# Patient Record
Sex: Female | Born: 1975 | Hispanic: No | Marital: Married | Smoking: Former smoker
Health system: Southern US, Community
[De-identification: ages and names within clinical notes are randomized; demographics above are authoritative.]

---

## 2007-06-22 ENCOUNTER — Inpatient Hospital Stay (HOSPITAL_COMMUNITY): Admission: AD | Admit: 2007-06-22 | Discharge: 2007-06-24 | Payer: Self-pay | Admitting: Obstetrics and Gynecology

## 2009-03-16 ENCOUNTER — Ambulatory Visit: Payer: Self-pay | Admitting: Internal Medicine

## 2009-03-16 DIAGNOSIS — J069 Acute upper respiratory infection, unspecified: Secondary | ICD-10-CM | POA: Insufficient documentation

## 2009-03-23 ENCOUNTER — Ambulatory Visit: Payer: Self-pay | Admitting: Internal Medicine

## 2009-03-23 DIAGNOSIS — J029 Acute pharyngitis, unspecified: Secondary | ICD-10-CM

## 2010-01-29 ENCOUNTER — Emergency Department (HOSPITAL_COMMUNITY)
Admission: EM | Admit: 2010-01-29 | Discharge: 2010-01-29 | Payer: Self-pay | Source: Home / Self Care | Admitting: Emergency Medicine

## 2010-01-29 IMAGING — CR DG CHEST 2V
2 series · 2 of 2 positions shown · non-contrast
Comparison: None.

CLINICAL DATA: Chest pain, cough

CHEST - 2 VIEW

[w chest pa]
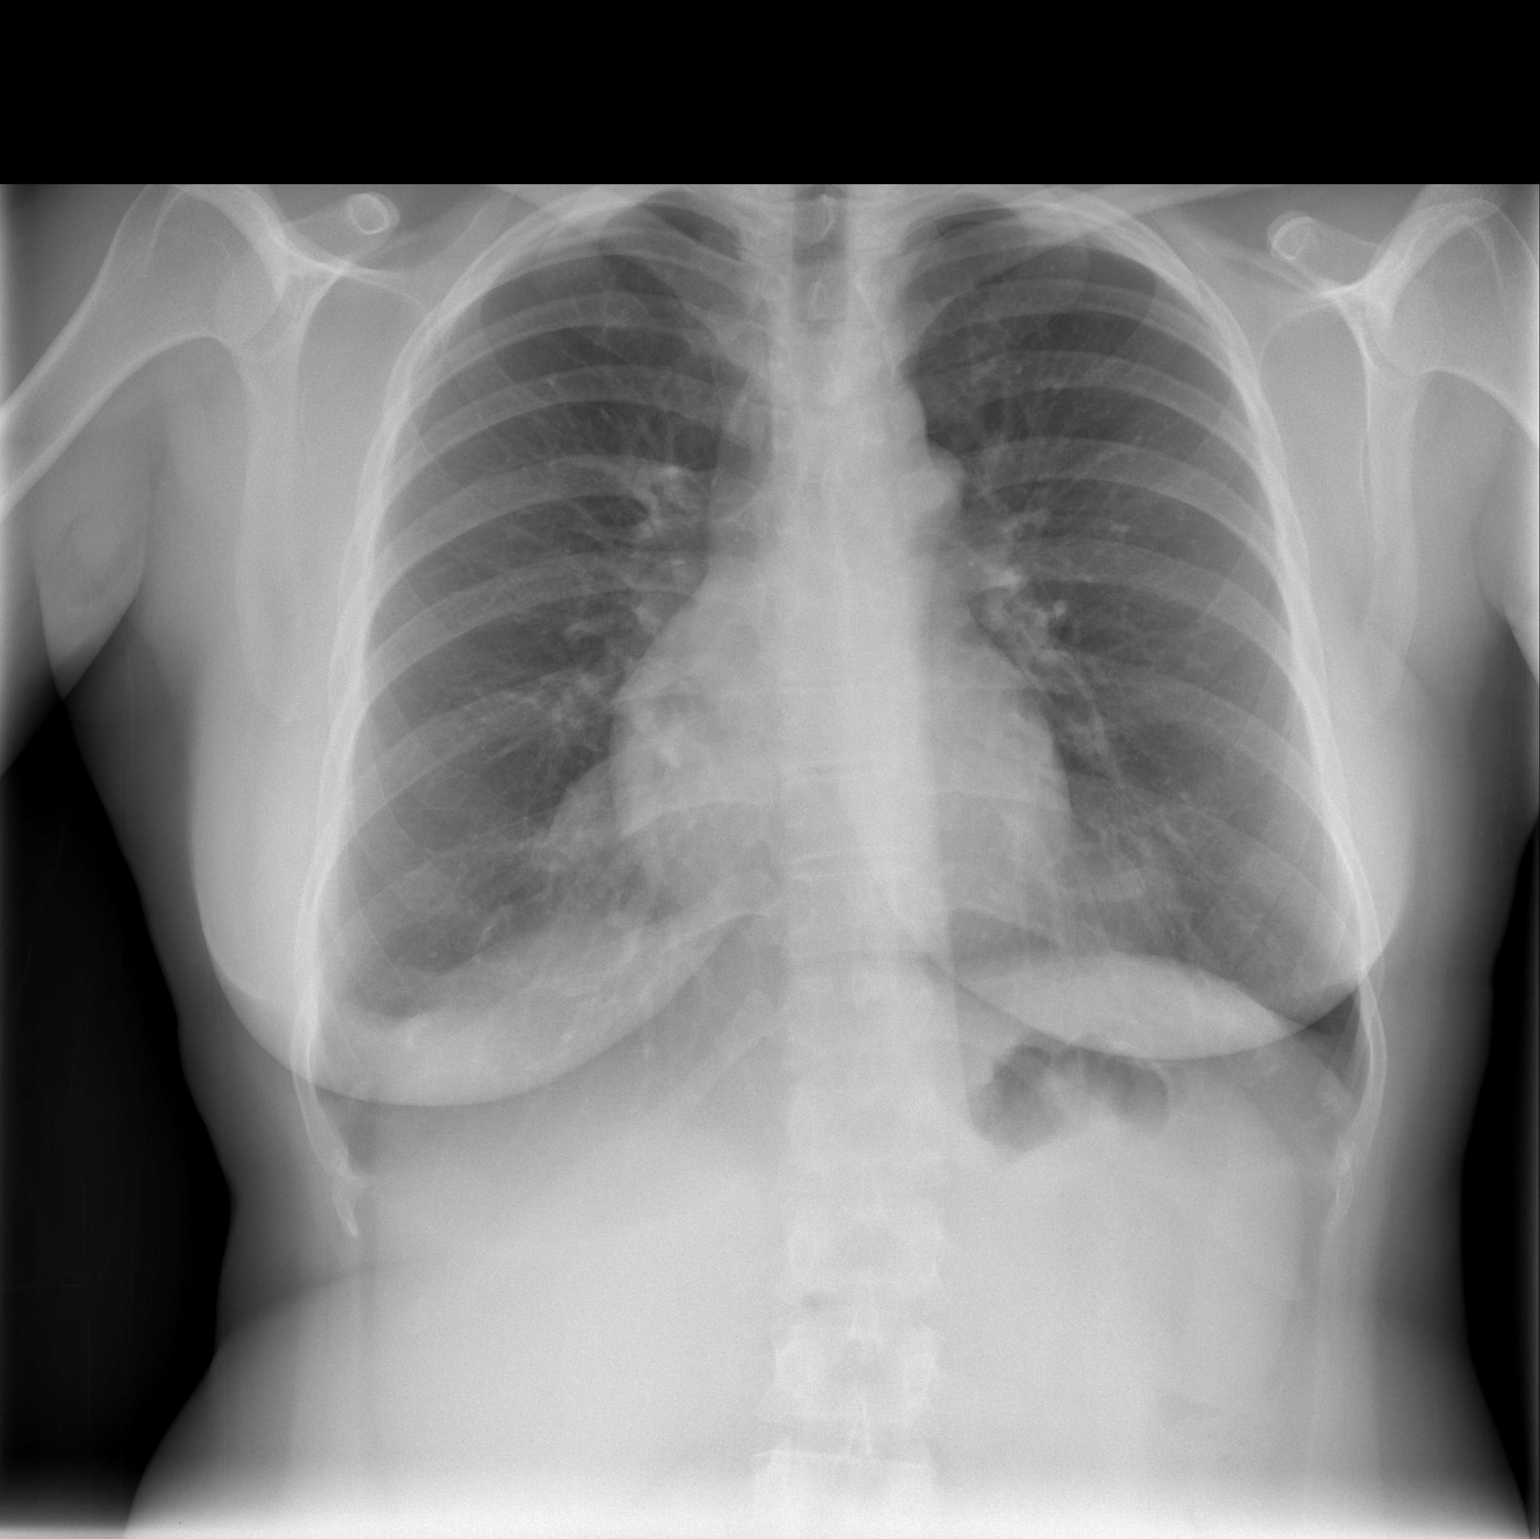

[w chest lat]
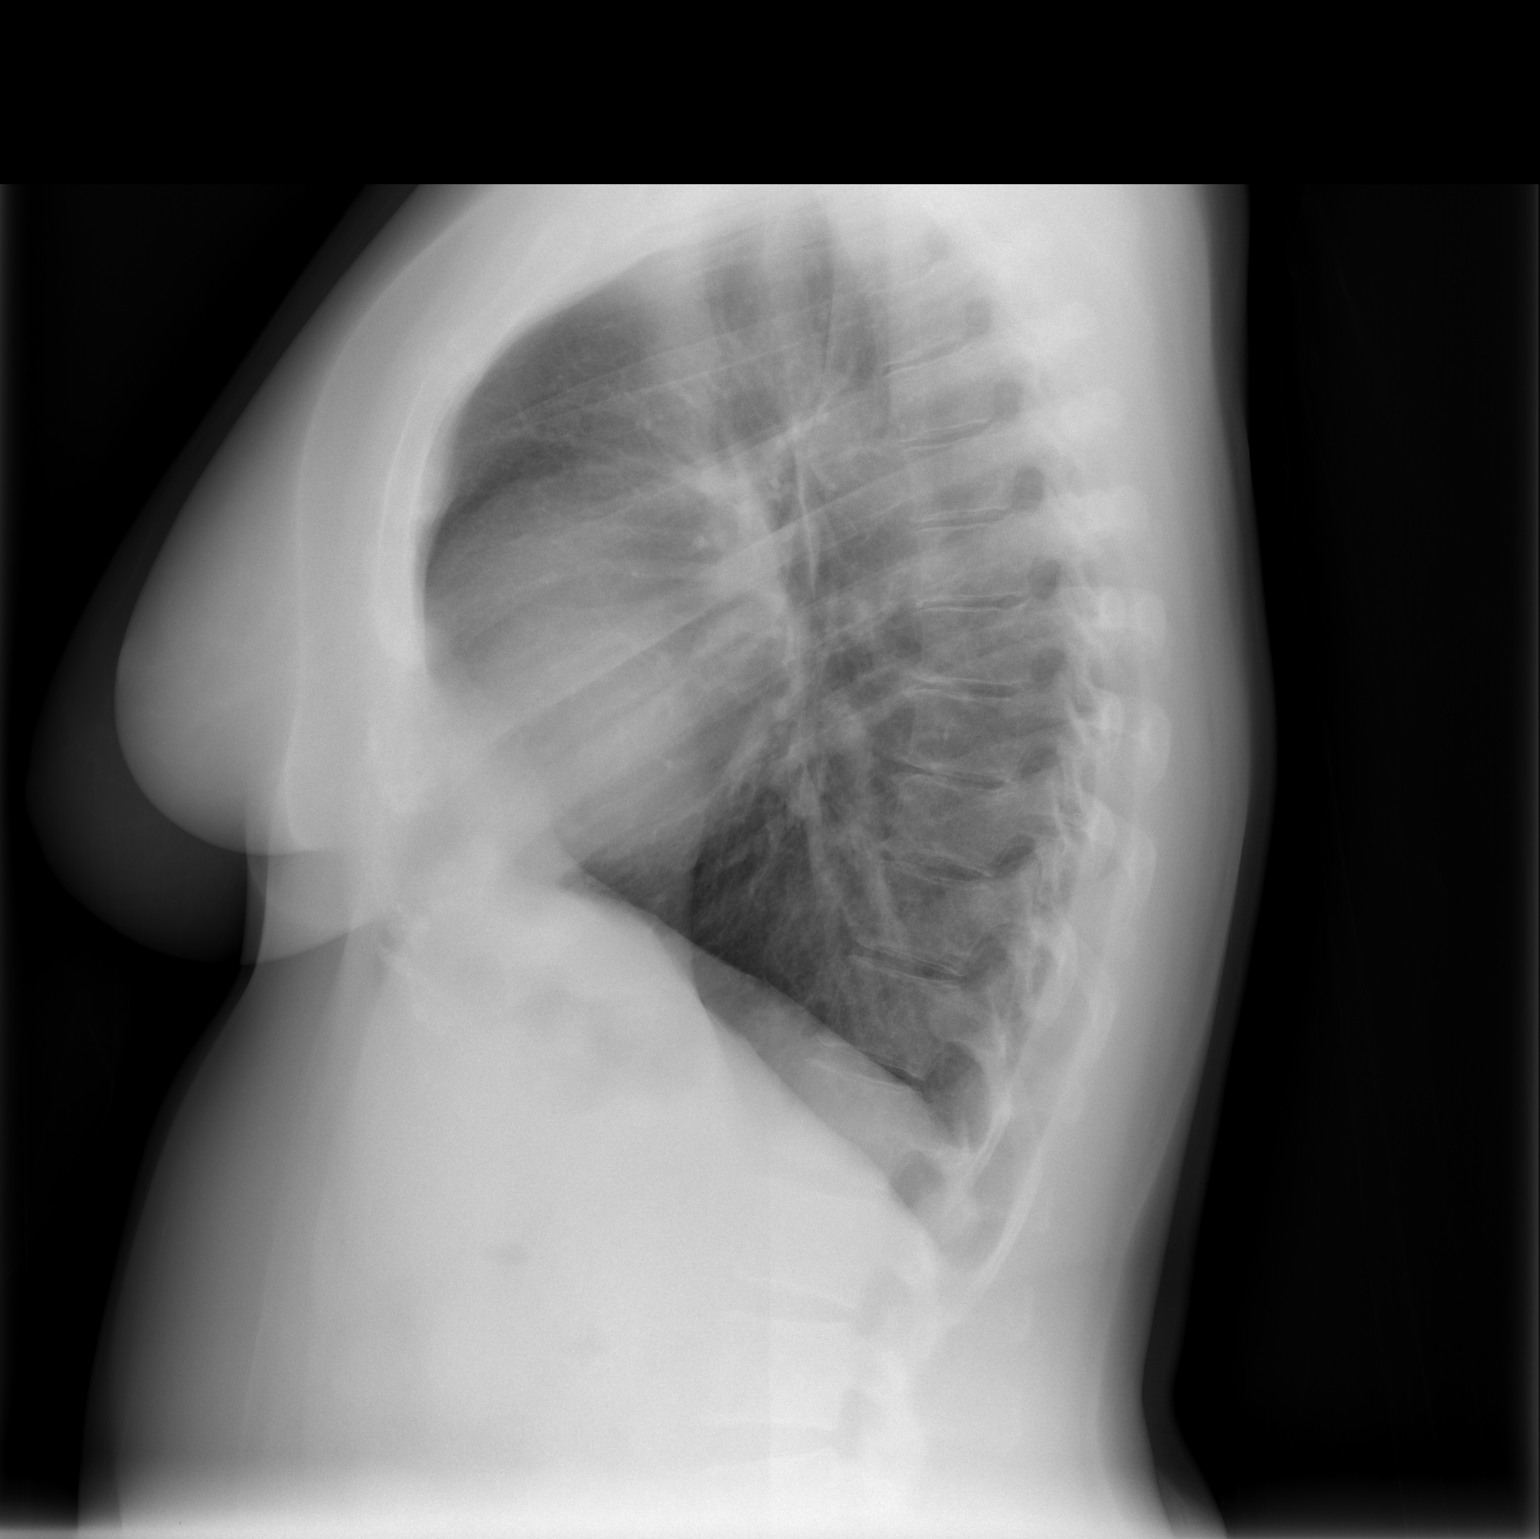

[2 of 2 positions shown; findings below may reference images not displayed]

FINDINGS: Normal heart size and vascularity.  Negative for
pneumonia, edema, effusion or pneumothorax.  Midline trachea.
Small nodular density noted along the left superior hilum along the
aortic contour.  No available comparison studies.  This is
nonspecific but could represent prominent vascular markings,
however underlying adenopathy not excluded.  Consider short-term
follow-up versus non emergent chest CT evaluation.
IMPRESSION: No acute airspace disease or pneumonia

Nodular density along the left hilum, as described.

## 2010-01-31 ENCOUNTER — Ambulatory Visit (HOSPITAL_COMMUNITY)
Admission: RE | Admit: 2010-01-31 | Discharge: 2010-01-31 | Payer: Self-pay | Source: Home / Self Care | Attending: Emergency Medicine | Admitting: Emergency Medicine

## 2010-02-28 ENCOUNTER — Telehealth: Payer: Self-pay

## 2010-03-07 NOTE — Assessment & Plan Note (Signed)
Summary: BRAND NEW PT/TO EST/CJR   Vital Signs:  Patient profile:   35 year old female Height:      60.5 inches Weight:      149 pounds BMI:     28.72 O2 Sat:      97 % on Room air Temp:     98.5 degrees F BP sitting:   120 / 76  (left arm) Cuff size:   regular  Vitals Entered By: Duard Brady LPN (March 16, 2009 3:00 PM)  O2 Flow:  Room air CC: c/o congestion, little coughing, sore throat - was seen in urgent care x2  biaxin 02/25/09 , amox 03/11/09 and was given an inhaler   CC:  c/o congestion, little coughing, sore throat - was seen in urgent care x2  biaxin 02/25/09 , and amox 03/11/09 and was given an inhaler.  History of Present Illness: 35 year old patient who is seen today to establish with our practice.  She has been evaluated and treated at the urgent care for an illness that has lasted  for 4 to 6 weeks.  She has had cough, sore throat, and earlier had fever.  On January 22 she was treated with Biaxin in 5 days ago was retreated with amoxicillin and a inhaler.  Denies any history of asthma or any recent wheezing.  She did discontinue tobacco 3 weeks ago.  Today she is improved.  She has no cough, but minimal congestion, and slight sore throat.  She is completing amoxicillin at this time  Preventive Screening-Counseling & Management  Alcohol-Tobacco     Smoking Status: quit < 6 months  Caffeine-Diet-Exercise     Does Patient Exercise: no  Allergies (verified): No Known Drug Allergies  Past History:  Past Medical History: gravida 3, para 3, abortus zero  Past Surgical History: gravida 3, para 3, abortus zero  Family History: Reviewed history and no changes required. father age 41 in good health mother, age 67, history of diabetes, and dyslipidemia two brothers 3 sisters all in good health  Social History: Reviewed history and no changes required. Married Regular exercise-no 3 young children discontinued tobacco January 2011Smoking Status:  quit <  6 months Does Patient Exercise:  no  Review of Systems       The patient complains of anorexia, fever, hoarseness, and prolonged cough.  The patient denies weight loss, weight gain, vision loss, decreased hearing, chest pain, syncope, dyspnea on exertion, peripheral edema, headaches, hemoptysis, abdominal pain, melena, hematochezia, severe indigestion/heartburn, hematuria, incontinence, genital sores, muscle weakness, suspicious skin lesions, transient blindness, difficulty walking, depression, unusual weight change, abnormal bleeding, enlarged lymph nodes, angioedema, and breast masses.    Physical Exam  General:  mildly overweight healthy, young adult in no distress.  Blood pressure low normal Head:  Normocephalic and atraumatic without obvious abnormalities. No apparent alopecia or balding. Eyes:  No corneal or conjunctival inflammation noted. EOMI. Perrla. Funduscopic exam benign, without hemorrhages, exudates or papilledema. Vision grossly normal. Ears:  External ear exam shows no significant lesions or deformities.  Otoscopic examination reveals clear canals, tympanic membranes are intact bilaterally without bulging, retraction, inflammation or discharge. Hearing is grossly normal bilaterally. Nose:  External nasal examination shows no deformity or inflammation. Nasal mucosa are pink and moist without lesions or exudates. Mouth:  Oral mucosa and oropharynx without lesions or exudates.  Teeth in good repair. Neck:  No deformities, masses, or tenderness noted. Chest Wall:  No deformities, masses, or tenderness noted. Lungs:  Normal respiratory effort, chest expands  symmetrically. Lungs are clear to auscultation, no crackles or wheezes. Heart:  Normal rate and regular rhythm. S1 and S2 normal without gallop, murmur, click, rub or other extra sounds. Abdomen:  Bowel sounds positive,abdomen soft and non-tender without masses, organomegaly or hernias noted. Msk:  No deformity or scoliosis noted  of thoracic or lumbar spine.   Pulses:  R and L carotid,radial,femoral,dorsalis pedis and posterior tibial pulses are full and equal bilaterally Extremities:  No clubbing, cyanosis, edema, or deformity noted with normal full range of motion of all joints.   Neurologic:  No cranial nerve deficits noted. Station and gait are normal. Plantar reflexes are down-going bilaterally. DTRs are symmetrical throughout. Sensory, motor and coordinative functions appear intact. Skin:  Intact without suspicious lesions or rashes Cervical Nodes:  No lymphadenopathy noted Axillary Nodes:  No palpable lymphadenopathy Inguinal Nodes:  No significant adenopathy Psych:  Cognition and judgment appear intact. Alert and cooperative with normal attention span and concentration. No apparent delusions, illusions, hallucinations   Impression & Recommendations:  Problem # 1:  Preventive Health Care (ICD-V70.0)  Problem # 2:  URI (ICD-465.9)  Patient Instructions: 1)  Get plenty of rest, drink lots of clear liquids, and use Tylenol or Ibuprofen for fever and comfort. Return in 7-10 days if you're not better:sooner if you're feeling worse. 2)  Vimovo- one twice daily

## 2010-03-07 NOTE — Assessment & Plan Note (Signed)
Summary: HEADACHES//SLM   Vital Signs:  Patient profile:   35 year old female Weight:      146 pounds Temp:     98.9 degrees F BP sitting:   112 / 80  (right arm)  Vitals Entered By: Duard Brady LPN (March 23, 2009 12:54 PM) CC: c/o sore throat, headache , swollen lymph nodes   CC:  c/o sore throat, headache , and swollen lymph nodes.  History of Present Illness: 35 year old patient to establish with her practice one week ago, who presents complaining of persistent headache and sore throat.  There is also some concern about cervical adenopathy.  She does have 3 young children, but apparently none have been ill.  Previously was seen at the urgent care and can with Biaxin and amoxicillin, but had been off antibiotics for about one week.  No documented fever.  She feels slightly unwell, and other than the sore throat has mild headache only  Allergies: No Known Drug Allergies  Review of Systems       The patient complains of anorexia, hoarseness, and headaches.  The patient denies fever, weight loss, weight gain, vision loss, decreased hearing, chest pain, syncope, dyspnea on exertion, peripheral edema, prolonged cough, hemoptysis, abdominal pain, melena, hematochezia, severe indigestion/heartburn, hematuria, incontinence, genital sores, muscle weakness, suspicious skin lesions, transient blindness, difficulty walking, depression, unusual weight change, abnormal bleeding, enlarged lymph nodes, angioedema, and breast masses.    Physical Exam  General:  overweight-appearing.  normal blood pressure Head:  Normocephalic and atraumatic without obvious abnormalities. No apparent alopecia or balding. Eyes:  No corneal or conjunctival inflammation noted. EOMI. Perrla. Funduscopic exam benign, without hemorrhages, exudates or papilledema. Vision grossly normal. Ears:  External ear exam shows no significant lesions or deformities.  Otoscopic examination reveals clear canals, tympanic  membranes are intact bilaterally without bulging, retraction, inflammation or discharge. Hearing is grossly normal bilaterally. Mouth:  pharyngeal erythema.   Neck:  No deformities, masses, or tenderness noted. Lungs:  Normal respiratory effort, chest expands symmetrically. Lungs are clear to auscultation, no crackles or wheezes. Heart:  Normal rate and regular rhythm. S1 and S2 normal without gallop, murmur, click, rub or other extra sounds. Abdomen:  no organomegaly   Impression & Recommendations:  Problem # 1:  SORE THROAT (ICD-462)  Orders: Rapid Strep (16109)  Problem # 2:  URI (ICD-465.9)  Patient Instructions: 1)  Please schedule a follow-up appointment as needed. 2)  Get plenty of rest, drink lots of clear liquids, and use Tylenol or Ibuprofen for fever and comfort. Return in 7-10 days if you're not better:sooner if you're feeling worse.  Laboratory Results  Date/Time Received: March 23, 2009 1:09 PM  Date/Time Reported: March 23, 2009 1:09 PM   Other Tests  Rapid Strep: negative Comments Wynona Canes, CMA  March 23, 2009 1:09 PM   Kit Test Internal QC: Negative   (Normal Range: Negative)

## 2010-03-08 NOTE — Progress Notes (Signed)
Summary: Pt is req to come in and get a CT Scan read  Phone Note Call from Patient Call back at [phone]   Caller: Patient Summary of Call: Pt called and had a CT scan done on 02/01/11, but no one has contacted pt about the results. Pt has copy of the CT Scan and is wondering is she could make an appt to have CT read? Pls advise.  Initial call taken by: Lucy Antigua,  February 28, 2010 4:54 PM  Follow-up for Phone Call        attempt to call -VM - no record of Dr. Frederica Kuster ordering a CT in Dec. - last seen in our office 03/2009. Check with who ever pt saw in Dec to check about a CT report. KIK    we will be happy to see to f/u on any problem she is having. KIK Follow-up by: Duard Brady LPN,  March 01, 2010 11:12 AM

## 2010-06-19 NOTE — H&P (Signed)
Rhonda [name], Rhonda [name]                ACCOUNT NO.:  [id number]   MEDICAL RECORD NO.:  [id number]          PATIENT TYPE:  INP   LOCATION:  9166                          FACILITY:  [facility]   PHYSICIAN:  Lenoard Aden, M.D.DATE OF BIRTH:  [birth year]   DATE OF ADMISSION:  06/22/2007  DATE OF DISCHARGE:                              HISTORY & PHYSICAL   CHIEF COMPLAINT:  Labor.   HISTORY OF PRESENT ILLNESS:  She is a 35 year old Hispanic female, G3 ,  P2, at 65 weeks' gestation with spontaneous rupture of membranes and  contractions since 8:00 a.m. this morning.   ALLERGIES:  She has no known drug allergies.   MEDICATIONS:  Prenatal vitamins.   SOCIAL HISTORY:  She is a nonsmoker, nondrinker. Denies domestic or  physical violence.   FAMILY HISTORY:  Noncontributory.   PAST MEDICAL HISTORY:  History of 2 uncomplicated vaginal deliveries,  both at 40 weeks, one 8 pounds, one 9 pounds.   PRENATAL COURSE TODAY:  Uncomplicated.   PHYSICAL EXAMINATION:  GENERAL:  She is a well-developed, well-nourished  female not in any acute distress.  HEENT:  Normal.  LUNGS:  Clear.  HEART:  Regular rhythm.  ABDOMEN:  Soft, gravid, and nontender.  Estimated fetal weight 7-1/2-8  pounds.  Cervix is 6, 100%, 0.  EXTREMITIES:  There are no cords.  NEUROLOGIC:  Nonfocal.  SKIN:  Intact.   IMPRESSION:  Term intrauterine pregnancy, in active labor.   PLAN:  Anticipate attempts at vaginal delivery.      Lenoard Aden, M.D.  Electronically Signed    RJT/MEDQ  D:  06/22/2007  T:  06/23/2007  Job:  [id number]

## 2010-10-31 LAB — CBC
HCT: 30.3 — ABNORMAL LOW
HCT: 33.7 — ABNORMAL LOW
Hemoglobin: 11.7 — ABNORMAL LOW
MCV: 86
Platelets: 219
RDW: 13.3

## 2011-04-26 ENCOUNTER — Other Ambulatory Visit: Payer: Self-pay | Admitting: Obstetrics and Gynecology

## 2011-04-26 DIAGNOSIS — Z1231 Encounter for screening mammogram for malignant neoplasm of breast: Secondary | ICD-10-CM

## 2011-05-07 ENCOUNTER — Ambulatory Visit (INDEPENDENT_AMBULATORY_CARE_PROVIDER_SITE_OTHER): Payer: Self-pay | Admitting: *Deleted

## 2011-05-07 ENCOUNTER — Ambulatory Visit (HOSPITAL_COMMUNITY): Payer: Self-pay | Attending: Obstetrics and Gynecology

## 2011-05-07 ENCOUNTER — Other Ambulatory Visit: Payer: Self-pay | Admitting: Obstetrics and Gynecology

## 2011-05-07 VITALS — BP 129/75 | HR 75 | Temp 98.5°F | Ht 63.0 in | Wt 160.9 lb

## 2011-05-07 DIAGNOSIS — Z01419 Encounter for gynecological examination (general) (routine) without abnormal findings: Secondary | ICD-10-CM

## 2011-05-07 DIAGNOSIS — N644 Mastodynia: Secondary | ICD-10-CM

## 2011-05-07 NOTE — Progress Notes (Signed)
Complaints of right outer quadrant breast pain x 2 months that is constant and rates it as a 4-6 out of 10.  Pap Smear:    Completed Pap smear today. Per patient never had a Pap smear. No Pap smear results in EPIC.  Physical exam: Breasts Breasts symmetrical. No skin abnormalities bilateral breasts. No nipple retraction bilateral breasts. No nipple discharge bilateral breasts. No lymphadenopathy. No lumps palpated bilateral breasts. Patient complained of right outer quadrant breast pain x 2 months that is constant that she rates at a 4-6 out of 10. Patient [facility] for right breast focal pain. Appointment scheduled for Tuesday, May 14, 2011 at 1430.         Pelvic/Bimanual   Ext Genitalia No lesions, no swelling and no discharge observed on external genitalia.         Vagina Vagina pink and normal texture. No lesions or discharge observed in vagina.          Cervix Cervix is present. Cervix pink and of normal texture. Small amount of menstrual bleeding at cervical os. Per patient she just got off menstrual cycle yesterday.      Uterus Uterus is present and palpable. Uterus in normal position and normal size.       Adnexae Bilateral ovaries present and palpable. No tenderness on palpation.        Rectovaginal No rectal exam completed today since patient had no rectal complaints. No skin abnormalities observed on exam.

## 2011-05-07 NOTE — Patient Instructions (Signed)
Taught patient how to perform BSE. Let her know [facility] will cover Pap smears every 3 years unless has a history of abnormal Pap smears. Patient is scheduled for a diagnostic mammogram on Tuesday, May 14, 2011 at 1430.  Patient aware of appointment and will be there. Let patient know will follow up with her within the next couple weeks with results. Patient verbalized understanding.

## 2011-05-14 ENCOUNTER — Ambulatory Visit
Admission: RE | Admit: 2011-05-14 | Discharge: 2011-05-14 | Disposition: A | Discharge status: Home / Self Care | Payer: No Typology Code available for payment source | Source: Ambulatory Visit | Attending: Obstetrics and Gynecology | Admitting: Obstetrics and Gynecology

## 2011-05-14 ENCOUNTER — Other Ambulatory Visit: Payer: Self-pay | Admitting: Obstetrics and Gynecology

## 2011-05-14 DIAGNOSIS — N644 Mastodynia: Secondary | ICD-10-CM

## 2011-05-27 ENCOUNTER — Encounter: Payer: Self-pay | Admitting: Obstetrics and Gynecology

## 2011-06-04 ENCOUNTER — Telehealth: Payer: Self-pay

## 2011-06-04 NOTE — Telephone Encounter (Signed)
Called pt and left message to return the call to Fonnie Mu @ [phone].  Tele # [phone] is not a working number.

## 2011-06-05 ENCOUNTER — Telehealth: Payer: Self-pay | Admitting: *Deleted

## 2011-06-05 NOTE — Telephone Encounter (Signed)
Patient returned call and left voicemail. Called patient back and verified patient has received results from [facility] clinic. Let her know her Pap smear was normal and that her diagnostic mammogram was negative. Let her know she will need a screening mammogram in 1 year. Patient verbalized understanding.

## 2016-12-16 ENCOUNTER — Encounter (HOSPITAL_COMMUNITY): Payer: Self-pay

## 2017-12-01 ENCOUNTER — Ambulatory Visit: Payer: Self-pay | Admitting: Emergency Medicine

## 2018-11-18 ENCOUNTER — Other Ambulatory Visit: Payer: Self-pay | Admitting: Adult Health Nurse Practitioner

## 2018-11-18 DIAGNOSIS — Z1231 Encounter for screening mammogram for malignant neoplasm of breast: Secondary | ICD-10-CM

## 2019-02-22 ENCOUNTER — Ambulatory Visit: Payer: BC Managed Care – PPO | Attending: Internal Medicine

## 2019-02-22 DIAGNOSIS — Z20822 Contact with and (suspected) exposure to covid-19: Secondary | ICD-10-CM | POA: Insufficient documentation

## 2019-02-23 LAB — NOVEL CORONAVIRUS, NAA: SARS-CoV-2, NAA: NOT DETECTED

## 2019-04-08 ENCOUNTER — Ambulatory Visit: Payer: BC Managed Care – PPO | Attending: Internal Medicine

## 2019-04-08 DIAGNOSIS — Z20822 Contact with and (suspected) exposure to covid-19: Secondary | ICD-10-CM

## 2019-04-09 LAB — NOVEL CORONAVIRUS, NAA: SARS-CoV-2, NAA: NOT DETECTED

## 2021-02-08 LAB — COLOGUARD: COLOGUARD: NEGATIVE

## 2021-02-08 LAB — EXTERNAL GENERIC LAB PROCEDURE: COLOGUARD: NEGATIVE

## 2021-05-01 ENCOUNTER — Encounter: Payer: Self-pay | Admitting: Family Medicine

## 2021-05-01 ENCOUNTER — Ambulatory Visit (INDEPENDENT_AMBULATORY_CARE_PROVIDER_SITE_OTHER): Payer: 59 | Admitting: Family Medicine

## 2021-05-01 VITALS — BP 124/70 | HR 89 | Temp 98.3°F | Ht 61.0 in | Wt 156.5 lb

## 2021-05-01 DIAGNOSIS — J40 Bronchitis, not specified as acute or chronic: Secondary | ICD-10-CM

## 2021-05-01 MED ORDER — AZITHROMYCIN 250 MG PO TABS: ORAL_TABLET | ORAL | 0 refills | Status: AC

## 2021-05-01 NOTE — Patient Instructions (Signed)
Welcome to [facility]! It was a pleasure meeting you today. ? ?As discussed, Please schedule a 7 month follow up visit today. ? ?PLEASE NOTE: ? ?If you had any LAB tests please let us know if you have not heard back within a few days. You may see your results on MyChart before we have a chance to review them but we will give you a call once they are reviewed by [place]. If we ordered any REFERRALS today, please let us know if you have not heard from their office within the next week.  ?Let us know through MyChart if you are needing REFILLS, or have your pharmacy send [place] the request. You can also use MyChart to communicate with me or any office staff. ? ?Please try these tips to maintain a healthy lifestyle: ? ?Eat most of your calories during the day when you are active. Eliminate processed foods including packaged sweets (pies, cakes, cookies), reduce intake of potatoes, white bread, white pasta, and white rice. Look for whole grain options, oat flour or almond flour. ? ?Each meal should contain half fruits/vegetables, one quarter protein, and one quarter carbs (no bigger than a computer mouse). ? ?Cut down on sweet beverages. This includes juice, soda, and sweet tea. Also watch fruit intake, though this is a healthier sweet option, it still contains natural sugar! Limit to 3 servings daily. ? ?Drink at least 1 glass of water with each meal and aim for at least 8 glasses per day ? ?Exercise at least 150 minutes every week.   ?

## 2021-05-01 NOTE — Progress Notes (Signed)
? ?New Patient Office Visit ? ?Subjective:  ?Patient ID: Rhonda [name], female    DOB: [birth year]  Age: 46 y.o. MRN: [id number] ? ?CC:  ?Chief Complaint  ?Patient presents with  ? Establish Care  ? Cough  ?  Productive cough that started about 8 or 9 days  ? Nasal Congestion  ? ? ?HPI- orig from [place] ?Rhonda [name] presents for new pt  last cpx 01/18/21-[facility].  New ins ?Cough-productive for 8-9 days. Congestion.  No f/c. No sob.  No v/d.  Covid neg 3 days ago. Not getting better.  ? ?R leg was painful on lateral side . Was for 1-2 mo. No injury. Pain to get up from sitting and lie on it. Gone now. Some pressure/poss  ? ?History reviewed. No pertinent past medical history. ? ?History reviewed. No pertinent surgical history. ? ?Family History  ?Problem Relation Age of Onset  ? Diabetes Mother   ? ? ?Social History  ? ?Socioeconomic History  ? Marital status: Married  ?  Spouse name: Not on file  ? Number of children: 3  ? Years of education: Not on file  ? Highest education level: Not on file  ?Occupational History  ? Not on file  ?Tobacco Use  ? Smoking status: Former  ?  Types: Cigarettes  ? Smokeless tobacco: Never  ?Vaping Use  ? Vaping Use: Never used  ?Substance and Sexual Activity  ? Alcohol use: Yes  ?  Alcohol/week: 4.0 standard drinks  ?  Types: 4 Glasses of wine per week  ? Drug use: No  ? Sexual activity: Yes  ?  Partners: Male  ?  Birth control/protection: None  ?Other Topics Concern  ? Not on file  ?Social History Narrative  ? WORKS AT [employer]  ? ?Social Determinants of Health  ? ?Financial Resource Strain: Not on file  ?Food Insecurity: Not on file  ?Transportation Needs: Not on file  ?Physical Activity: Not on file  ?Stress: Not on file  ?Social Connections: Not on file  ?Intimate Partner Violence: Not on file  ? ? ?ROS  ?ROS: ?Gen: no fever, chills  ?Skin: no rash, itching ?ENT: URI now ?Eyes: no blurry vision, double vision ?Resp: URI now ?CV: no CP, palpitations, LE edema,   ?GI: no heartburn, n/v/d/c, abd pain ?GU: no dysuria, urgency, frequency, hematuria.  LMP done 2 days ago.  No contraception ?MSK: no joint pain, myalgias, back pain ?Neuro: no dizziness, headache, weakness, vertigo ?Psych: no depression, anxiety, insomnia, SI  ? ?Objective:  ? ?Today's Vitals: BP 124/70   Pulse 89   Temp 98.3 ?F (36.8 ?C) (Temporal)   Ht 5' 1"  (1.549 m)   Wt 156 lb 8 oz (71 kg)   LMP 04/29/2021 (Exact Date) Comment: finished 2 days ago  SpO2 96%   BMI 29.57 kg/m?  ? ?Physical Exam  ?Gen: WDWN NAD WF ?HEENT: NCAT, conjunctiva not injected, sclera nonicteric ?TM WNL B, OP moist, no exudates  ?NECK:  supple, no thyromegaly, no nodes, no carotid bruits ?CARDIAC: RRR, S1S2+, no murmur. DP 2+B ?LUNGS: CTAB. No wheezes. Congested some ?ABDOMEN:  BS+, soft, NTND, No HSM, no masses ?EXT:  no edema ?MSK: no gross abnormalities.  ?NEURO: A&O x3.  CN II-XII intact.  ?PSYCH: normal mood. Good eye contact  ? ?Assessment & Plan:  ? ?Problem List Items Addressed This Visit   ?None ?Visit Diagnoses   ? ? Bronchitis    -  Primary  ? ?  ?  Bronchitis-zpk to hold.  Discussed contraception.   ?R hip pain resolved.  ? ?Outpatient Encounter Medications as of 05/01/2021  ?Medication Sig  ? azithromycin (ZITHROMAX) 250 MG tablet Take 2 tablets on day 1, then 1 tablet daily on days 2 through 5  ? ?No facility-administered encounter medications on file as of 05/01/2021.  ? ? ?Follow-up: Return for annual in Fall.  ? ?Wellington Hampshire, MD ?

## 2021-10-29 ENCOUNTER — Encounter: Payer: Self-pay | Admitting: *Deleted

## 2022-01-10 ENCOUNTER — Ambulatory Visit (INDEPENDENT_AMBULATORY_CARE_PROVIDER_SITE_OTHER): Payer: Commercial Managed Care - HMO | Admitting: Family Medicine

## 2022-01-10 ENCOUNTER — Encounter: Payer: Self-pay | Admitting: Family Medicine

## 2022-01-10 VITALS — BP 130/76 | HR 73 | Temp 98.7°F | Ht 61.0 in | Wt 166.5 lb

## 2022-01-10 DIAGNOSIS — Z23 Encounter for immunization: Secondary | ICD-10-CM

## 2022-01-10 DIAGNOSIS — Z1159 Encounter for screening for other viral diseases: Secondary | ICD-10-CM | POA: Diagnosis not present

## 2022-01-10 DIAGNOSIS — Z0000 Encounter for general adult medical examination without abnormal findings: Secondary | ICD-10-CM

## 2022-01-10 LAB — LIPID PANEL
Cholesterol: 184 mg/dL (ref 0–200)
HDL: 55 mg/dL (ref >39.00)
LDL Cholesterol: 114 mg/dL — ABNORMAL HIGH (ref 0–99)
NonHDL: 128.93
Total CHOL/HDL Ratio: 3
Triglycerides: 77 mg/dL (ref 0.0–149.0)
VLDL: 15.4 mg/dL (ref 0.0–40.0)

## 2022-01-10 LAB — CBC WITH DIFFERENTIAL/PLATELET
Basophils Absolute: 0.1 10*3/uL (ref 0.0–0.1)
Basophils Relative: 0.9 % (ref 0.0–3.0)
Eosinophils Absolute: 0.3 10*3/uL (ref 0.0–0.7)
Eosinophils Relative: 3.8 % (ref 0.0–5.0)
HCT: 42.6 % (ref 36.0–46.0)
Hemoglobin: 15 g/dL (ref 12.0–15.0)
Lymphocytes Relative: 25.2 % (ref 12.0–46.0)
Lymphs Abs: 1.9 10*3/uL (ref 0.7–4.0)
MCHC: 35.1 g/dL (ref 30.0–36.0)
MCV: 92.5 fl (ref 78.0–100.0)
Monocytes Absolute: 0.5 10*3/uL (ref 0.1–1.0)
Monocytes Relative: 6.7 % (ref 3.0–12.0)
Neutro Abs: 4.7 10*3/uL (ref 1.4–7.7)
Neutrophils Relative %: 63.4 % (ref 43.0–77.0)
Platelets: 240 10*3/uL (ref 150.0–400.0)
RBC: 4.61 Mil/uL (ref 3.87–5.11)
RDW: 12.9 % (ref 11.5–15.5)
WBC: 7.4 10*3/uL (ref 4.0–10.5)

## 2022-01-10 LAB — COMPREHENSIVE METABOLIC PANEL
ALT: 13 U/L (ref 0–35)
AST: 14 U/L (ref 0–37)
Albumin: 4.6 g/dL (ref 3.5–5.2)
Alkaline Phosphatase: 68 U/L (ref 39–117)
BUN: 12 mg/dL (ref 6–23)
CO2: 28 mEq/L (ref 19–32)
Calcium: 9.2 mg/dL (ref 8.4–10.5)
Chloride: 105 mEq/L (ref 96–112)
Creatinine, Ser: 0.79 mg/dL (ref 0.40–1.20)
GFR: 89.63 mL/min (ref >60.00)
Glucose, Bld: 91 mg/dL (ref 70–99)
Potassium: 4.9 mEq/L (ref 3.5–5.1)
Sodium: 140 mEq/L (ref 135–145)
Total Bilirubin: 1.1 mg/dL (ref 0.2–1.2)
Total Protein: 6.9 g/dL (ref 6.0–8.3)

## 2022-01-10 LAB — HEMOGLOBIN A1C: Hgb A1c MFr Bld: 5.4 % (ref 4.6–6.5)

## 2022-01-10 LAB — TSH: TSH: 0.96 u[IU]/mL (ref 0.35–5.50)

## 2022-01-10 NOTE — Patient Instructions (Addendum)
It was very nice to see you today!  Happy Holidays!  Biotin-hair and nails.  Goal of blood pressure is <130/80.  Pulse 80ish   PLEASE NOTE:  If you had any lab tests please let us know if you have not heard back within a few days. You may see your results on MyChart before we have a chance to review them but we will give you a call once they are reviewed by [place]. If we ordered any referrals today, please let us know if you have not heard from their office within the next week.   Please try these tips to maintain a healthy lifestyle:  Eat most of your calories during the day when you are active. Eliminate processed foods including packaged sweets (pies, cakes, cookies), reduce intake of potatoes, white bread, white pasta, and white rice. Look for whole grain options, oat flour or almond flour.  Each meal should contain half fruits/vegetables, one quarter protein, and one quarter carbs (no bigger than a computer mouse).  Cut down on sweet beverages. This includes juice, soda, and sweet tea. Also watch fruit intake, though this is a healthier sweet option, it still contains natural sugar! Limit to 3 servings daily.  Drink at least 1 glass of water with each meal and aim for at least 8 glasses per day  Exercise at least 150 minutes every week.

## 2022-01-10 NOTE — Progress Notes (Signed)
Phone [phone]   Subjective:   Patient is a 46 y.o. female presenting for annual physical.    Chief Complaint  Patient presents with   Follow-up    7 month follow-up  Fasting    Annual Hair loss-2 yrs.  Head only. Colors hair.  No FH hair loss.  Some stress. Tired.  Sleeping well.  Started exercising.  See problem oriented charting- ROS- ROS: Gen: no fever, chills  Skin: no rash, itching ENT: no ear pain, ear drainage, nasal congestion, rhinorrhea, sinus pressure, sore throat Eyes: no blurry vision, double vision Resp: no cough, wheeze,SOB CV: no CP, palpitations, LE edema,  GI: no heartburn, n/v/d/c, abd pain GU: no dysuria, urgency, frequency, hematuria.  Menses regular. MSK: occ pain across chest for 2 days-occurs about every 2-3 wks.  Does lift a lot Neuro: no dizziness, headache, weakness, vertigo Psych: no depression, anxiety, insomnia, SI   The following were reviewed and entered/updated in epic: History reviewed. No pertinent past medical history. Patient Active Problem List   Diagnosis Date Noted   SORE THROAT 03/23/2009   URI 03/16/2009   History reviewed. No pertinent surgical history.  Family History  Problem Relation Age of Onset   Diabetes Mother     Medications- reviewed and updated No current outpatient medications on file.   No current facility-administered medications for this visit.    Allergies-reviewed and updated No Known Allergies  Social History   Social History Narrative   WORKS AT [employer]   Objective  Objective:  BP 130/76   Pulse 73   Temp 98.7 F (37.1 C) (Temporal)   Ht 5\' 1"  (1.549 m)   Wt 166 lb 8 oz (75.5 kg)   LMP 12/19/2021 (Approximate)   SpO2 98%   BMI 31.46 kg/m  Physical Exam  Gen: WDWN NAD HEENT: NCAT, conjunctiva not injected, sclera nonicteric TM WNL B, OP moist, no exudates  NECK:  supple, no thyromegaly, no nodes, no carotid bruits CARDIAC: RRR, S1S2+, no murmur. DP 2+B LUNGS:  CTAB. No wheezes ABDOMEN:  BS+, soft, NTND, No HSM, no masses EXT:  no edema MSK: no gross abnormalities. MS 5/5 all 4 NEURO: A&O x3.  CN II-XII intact.  PSYCH: normal mood. Good eye contact    Assessment and Plan   Health Maintenance counseling: 1. Anticipatory guidance: Patient counseled regarding regular dental exams q6 months, eye exams,  avoiding smoking and second hand smoke, limiting alcohol to 1 beverage per day, no illicit drugs.   2. Risk factor reduction:  Advised patient of need for regular exercise and diet rich and fruits and vegetables to reduce risk of heart attack and stroke. Exercise- +.  Wt Readings from Last 3 Encounters:  01/10/22 166 lb 8 oz (75.5 kg)  05/01/21 156 lb 8 oz (71 kg)  05/07/11 160 lb 14.4 oz (73 kg)   3. Immunizations/screenings/ancillary studies Immunization History  Administered Date(s) Administered   PFIZER(Purple Top)SARS-COV-2 Vaccination 04/09/2019, 04/30/2019   Td 12/12/1997, 07/18/1998   Varicella 09/21/1998   Health Maintenance Due  Topic Date Due   Hepatitis C Screening  Never done   DTaP/Tdap/Td (3 - Tdap) 07/17/2008    4. Cervical cancer screening- utd 5. Breast cancer screening-  mammogram sch 6. Colon cancer screening - UTD 7. Skin cancer screening- advised regular sunscreen use. Denies worrisome, changing, or new skin lesions.  8. Birth control/STD check- condoms 9. Osteoporosis screening- n/a 10. Smoking associated screening - non smoker  Problem List Items Addressed This Visit  None Visit Diagnoses     Wellness examination    -  Primary   Encounter for hepatitis C screening test for low risk patient          Wellness-anticipatory guidance.  Work on [employer].  Check CBC,CMP,lipids,TSH, A1C.  F/u 1 yr Tdap given.  Get mamm  Recommended follow up: annualNo follow-ups on file. No future appointments.  Lab/Order associations:+ fasting   ICD-10-CM   1. Wellness examination  Z00.00     2. Encounter for hepatitis C  screening test for low risk patient  Z11.59       No orders of the defined types were placed in this encounter.   Wellington Hampshire, MD

## 2023-02-10 ENCOUNTER — Telehealth: Payer: Self-pay | Admitting: Family Medicine

## 2023-02-10 NOTE — Telephone Encounter (Signed)
 Copied from CRM [id number]. Topic: Clinical - Request for Lab/Test Order >> Feb 10, 2023 10:08 AM Graeme ORN wrote: Reason for CRM: Pt called to make appt for annual check up. States she wants to have fasting lab work as well. No order yet for labs. Thank You

## 2023-02-10 NOTE — Telephone Encounter (Signed)
 Left message to return my call. Labs are normally done at visit, is patient asking to come in for fasting labs before her 4 pm appointment.

## 2023-03-03 ENCOUNTER — Encounter: Payer: Self-pay | Admitting: Family Medicine

## 2023-03-03 ENCOUNTER — Other Ambulatory Visit (HOSPITAL_COMMUNITY)
Admission: RE | Admit: 2023-03-03 | Discharge: 2023-03-03 | Disposition: A | Discharge status: Home / Self Care | Payer: No Typology Code available for payment source | Source: Ambulatory Visit | Attending: Family Medicine | Admitting: Family Medicine

## 2023-03-03 ENCOUNTER — Encounter: Payer: No Typology Code available for payment source | Admitting: Family

## 2023-03-03 ENCOUNTER — Encounter: Payer: No Typology Code available for payment source | Admitting: Family Medicine

## 2023-03-03 ENCOUNTER — Ambulatory Visit (INDEPENDENT_AMBULATORY_CARE_PROVIDER_SITE_OTHER): Payer: No Typology Code available for payment source | Admitting: Family Medicine

## 2023-03-03 VITALS — BP 130/78 | HR 61 | Temp 98.1°F | Resp 18 | Ht 61.0 in | Wt 165.0 lb

## 2023-03-03 DIAGNOSIS — Z0000 Encounter for general adult medical examination without abnormal findings: Secondary | ICD-10-CM

## 2023-03-03 DIAGNOSIS — Z124 Encounter for screening for malignant neoplasm of cervix: Secondary | ICD-10-CM

## 2023-03-03 DIAGNOSIS — Z1159 Encounter for screening for other viral diseases: Secondary | ICD-10-CM | POA: Diagnosis not present

## 2023-03-03 NOTE — Patient Instructions (Signed)

## 2023-03-03 NOTE — Progress Notes (Signed)
Phone [phone]   Subjective:   Patient is a 48 y.o. female presenting for annual physical.    Chief Complaint  Patient presents with   Annual Exam    CPE Fasting   LMP early December.  Some spotting today. Some exercise B12 and D.  L wrist dorsum ganglion  See problem oriented charting- ROS- ROS: Gen: no fever, chills  Skin: no rash, itching ENT: no ear pain, ear drainage, nasal congestion, rhinorrhea, sinus pressure, sore throat Eyes: no blurry vision, double vision Resp: no cough, wheeze,SOB CV: no CP, palpitations, LE edema,  GI: no heartburn, n/v/d/c, abd pain GU: no dysuria, urgency, frequency, hematuria.  Pain w/intercourse for 2 months  some itch.  No d/c.  Menses less regular.  No hot flashes MSK: no joint pain, myalgias, back pain Neuro: no dizziness, headache, weakness, vertigo Psych: no depression, anxiety, insomnia, SI   The following were reviewed and entered/updated in epic: History reviewed. No pertinent past medical history. Patient Active Problem List   Diagnosis Date Noted   SORE THROAT 03/23/2009   Acute upper respiratory infection 03/16/2009   History reviewed. No pertinent surgical history.  Family History  Problem Relation Age of Onset   Hypertension Mother    Diabetes Mother    Hypertension Father     Medications- reviewed and updated No current outpatient medications on file.   No current facility-administered medications for this visit.    Allergies-reviewed and updated No Known Allergies  Social History   Social History Narrative   WORKS AT [employer]   Objective  Objective:  BP 130/78   Pulse 61   Temp 98.1 F (36.7 C) (Temporal)   Resp 18   Ht 5\' 1"  (1.549 m)   Wt 165 lb (74.8 kg)   LMP  (LMP Unknown)   SpO2 98%   BMI 31.18 kg/m  Physical Exam  Gen: WDWN NAD HEENT: NCAT, conjunctiva not injected, sclera nonicteric TM WNL B, OP moist, no exudates  NECK:  supple, no thyromegaly, no nodes, no  carotid bruits CARDIAC: RRR, S1S2+, no murmur. DP 2+B LUNGS: CTAB. No wheezes ABDOMEN:  BS+, soft, NTND, No HSM, no masses EXT:  no edema MSK: no gross abnormalities. MS 5/5 all 4 NEURO: A&O x3.  CN II-XII intact.  PSYCH: normal mood. Good eye contact Approx 1 cm ganglion cyst dorsum L wrist in middle Breasts: Examined lying and sitting.              Right:   Without masses, retractions,  nipple discharge or axillary adenopathy.               Left:     Without masses, retractions, nipple discharge or axillary adenopathy. Genitourinary              Inguinal/mons:  Normal without inguinal adenopathy             External genitalia:  Normal appearing vulva with no masses, tenderness, or lesions             BUS/Urethra/Skene's glands:  Normal             Vagina:  Normal appearing with normal color and discharge, no lesions             Cervix:  Normal appearing without discharge .  Several nabothian cysts             Uterus:  Normal in size, shape and contour.  Midline and mobile, nontender  Adnexa/parametria:                           Rt:        Normal in size, without masses or tenderness.                         Lt:        Normal in size, without masses or tenderness.             Anus and perineum: Normal  Chaperone present QJ     Assessment and Plan   Health Maintenance counseling: 1. Anticipatory guidance: Patient counseled regarding regular dental exams q6 months, eye exams,  avoiding smoking and second hand smoke, limiting alcohol to 1 beverage per day, no illicit drugs.   2. Risk factor reduction:  Advised patient of need for regular exercise and diet rich and fruits and vegetables to reduce risk of heart attack and stroke. Exercise- encouraged.  Wt Readings from Last 3 Encounters:  03/03/23 165 lb (74.8 kg)  01/10/22 166 lb 8 oz (75.5 kg)  05/01/21 156 lb 8 oz (71 kg)   3. Immunizations/screenings/ancillary studies Immunization History  Administered Date(s)  Administered   PFIZER(Purple Top)SARS-COV-2 Vaccination 04/09/2019, 04/30/2019   Td 12/12/1997, 07/18/1998   Tdap 01/10/2022   Varicella 09/21/1998   Health Maintenance Due  Topic Date Due   Hepatitis C Screening  Never done   Cervical Cancer Screening (HPV/Pap Cotest)  05/07/2014    4. Cervical cancer screening- today 5. Breast cancer screening-  mammogram will sch 6. Colon cancer screening - utd 7. Skin cancer screening- advised regular sunscreen use. Denies worrisome, changing, or new skin lesions.  8. Birth control/STD check- encouraged 9. Osteoporosis screening- n/a 10. Smoking associated screening - non smoker  Wellness examination -     Lipid panel -     Comprehensive metabolic panel -     CBC with Differential/Platelet -     Hemoglobin A1c -     TSH -     Hepatitis C antibody  Screening for viral disease  Screening for cervical cancer -     Cytology - PAP   Wellness-anticipatory guidance.  Work on Diet/Exercise  Check CBC,CMP,lipids,TSH, A1C.  F/u 1 yr  pap done Ganglion cyst L wrist-asymptomatic.  monitor  Recommended follow up: Return in about 1 year (around 03/02/2024) for annual physical.  Lab/Order associations:+ fasting  Angelena Sole, MD

## 2023-03-04 ENCOUNTER — Encounter: Payer: Self-pay | Admitting: Family Medicine

## 2023-03-04 LAB — LIPID PANEL
Cholesterol: 216 mg/dL — ABNORMAL HIGH (ref 0–200)
HDL: 56 mg/dL (ref >39.00)
LDL Cholesterol: 147 mg/dL — ABNORMAL HIGH (ref 0–99)
NonHDL: 159.9
Total CHOL/HDL Ratio: 4
Triglycerides: 65 mg/dL (ref 0.0–149.0)
VLDL: 13 mg/dL (ref 0.0–40.0)

## 2023-03-04 LAB — CBC WITH DIFFERENTIAL/PLATELET
Basophils Absolute: 0.1 10*3/uL (ref 0.0–0.1)
Basophils Relative: 0.8 % (ref 0.0–3.0)
Eosinophils Absolute: 0.1 10*3/uL (ref 0.0–0.7)
Eosinophils Relative: 1.4 % (ref 0.0–5.0)
HCT: 42.5 % (ref 36.0–46.0)
Hemoglobin: 14.5 g/dL (ref 12.0–15.0)
Lymphocytes Relative: 30.7 % (ref 12.0–46.0)
Lymphs Abs: 2.2 10*3/uL (ref 0.7–4.0)
MCHC: 34.2 g/dL (ref 30.0–36.0)
MCV: 94.9 fL (ref 78.0–100.0)
Monocytes Absolute: 0.4 10*3/uL (ref 0.1–1.0)
Monocytes Relative: 6 % (ref 3.0–12.0)
Neutro Abs: 4.3 10*3/uL (ref 1.4–7.7)
Neutrophils Relative %: 61.1 % (ref 43.0–77.0)
Platelets: 225 10*3/uL (ref 150.0–400.0)
RBC: 4.48 Mil/uL (ref 3.87–5.11)
RDW: 13 % (ref 11.5–15.5)
WBC: 7 10*3/uL (ref 4.0–10.5)

## 2023-03-04 LAB — COMPREHENSIVE METABOLIC PANEL
ALT: 20 U/L (ref 0–35)
AST: 20 U/L (ref 0–37)
Albumin: 4.7 g/dL (ref 3.5–5.2)
Alkaline Phosphatase: 64 U/L (ref 39–117)
BUN: 12 mg/dL (ref 6–23)
CO2: 26 meq/L (ref 19–32)
Calcium: 9.2 mg/dL (ref 8.4–10.5)
Chloride: 104 meq/L (ref 96–112)
Creatinine, Ser: 0.69 mg/dL (ref 0.40–1.20)
GFR: 103.16 mL/min (ref >60.00)
Glucose, Bld: 81 mg/dL (ref 70–99)
Potassium: 3.7 meq/L (ref 3.5–5.1)
Sodium: 138 meq/L (ref 135–145)
Total Bilirubin: 1 mg/dL (ref 0.2–1.2)
Total Protein: 7.1 g/dL (ref 6.0–8.3)

## 2023-03-04 LAB — TSH: TSH: 0.95 u[IU]/mL (ref 0.35–5.50)

## 2023-03-04 LAB — HEPATITIS C ANTIBODY: Hepatitis C Ab: NONREACTIVE

## 2023-03-04 LAB — HEMOGLOBIN A1C: Hgb A1c MFr Bld: 5.4 % (ref 4.6–6.5)

## 2023-03-04 NOTE — Progress Notes (Signed)
Labs great except cholesterol high.  Needs to work on diet/exercise.  Sch appt in 3-6 months for reck w/me

## 2023-03-07 LAB — CYTOLOGY - PAP
Comment: NEGATIVE
Diagnosis: NEGATIVE
High risk HPV: NEGATIVE

## 2023-03-17 ENCOUNTER — Ambulatory Visit: Payer: Self-pay | Admitting: Family Medicine

## 2023-03-17 NOTE — Telephone Encounter (Signed)
 This RN made first attempt to triage patient. No answer, unable to leave a message. Will continue to attempt.  Summary: sick, headache, fever present   Copied From CRM [id number]. Reason for Triage: patient has been sick all weekend, felt fever, coughing, headaches (especially when coughing)

## 2023-03-17 NOTE — Telephone Encounter (Signed)
 Chief Complaint: Cough Symptoms: Cough, wheezing, body aches, fatigue Frequency: Since Saturday Pertinent Negatives: Patient denies relief Disposition: [] ED /[x] Urgent Care (no appt availability in office) / [] Appointment(In office/virtual)/ []  [facility]/ [] Home Care/ [] Refused Recommended Disposition /[] [facility]/ []  Follow-up with PCP Additional Notes: Patient called in to report a cough and other cold/flu symptoms that started on Saturday. Patient is also experiencing wheezing, body aches and fatigue. Patient stated she has frequent coughing spells. Patient denied SOB, but stated the wheezing is concerning to her. This RN advised patient to see a provider within 4 hours. No availability with PCP office or with other offices. This RN advised UC. This RN assisted patient with finding an UC that had availability today.   Reason for Disposition  Wheezing is present  Answer Assessment - Initial Assessment Questions 1. ONSET: "When did the cough begin?"      Saturday 2. SEVERITY: "How bad is the cough today?"      States she has coughing spells when laying flat 3. SPUTUM: "Describe the color of your sputum" (none, dry cough; clear, white, yellow, green)     Clear 4. HEMOPTYSIS: "Are you coughing up any blood?" If so ask: "How much?" (flecks, streaks, tablespoons, etc.)     Denies 5. DIFFICULTY BREATHING: "Are you having difficulty breathing?" If Yes, ask: "How bad is it?" (e.g., mild, moderate, severe)    - MILD: No SOB at rest, mild SOB with walking, speaks normally in sentences, can lie down, no retractions, pulse < 100.    - MODERATE: SOB at rest, SOB with minimal exertion and prefers to sit, cannot lie down flat, speaks in phrases, mild retractions, audible wheezing, pulse 100-120.    - SEVERE: Very SOB at rest, speaks in single words, struggling to breathe, sitting hunched forward, retractions, pulse > 120      States she is wheezing, denies SOB 6. FEVER: "Do you  have a fever?" If Yes, ask: "What is your temperature, how was it measured, and when did it start?"     States she feels feverish, has not checked temperature 7. CARDIAC HISTORY: "Do you have any history of heart disease?" (e.g., heart attack, congestive heart failure)      Denies 8. LUNG HISTORY: "Do you have any history of lung disease?"  (e.g., pulmonary embolus, asthma, emphysema)     Denies 10. OTHER SYMPTOMS: "Do you have any other symptoms?" (e.g., runny nose, wheezing, chest pain)       Wheezing, body aches, fatigue  Protocols used: Cough - Acute Productive-A-AH

## 2023-03-18 ENCOUNTER — Encounter: Payer: Self-pay | Admitting: Family Medicine

## 2023-03-18 ENCOUNTER — Ambulatory Visit: Payer: No Typology Code available for payment source | Admitting: Family Medicine

## 2023-03-18 VITALS — BP 120/80 | HR 71 | Temp 98.2°F | Resp 18 | Ht 61.0 in | Wt 165.1 lb

## 2023-03-18 DIAGNOSIS — R059 Cough, unspecified: Secondary | ICD-10-CM | POA: Diagnosis not present

## 2023-03-18 DIAGNOSIS — J069 Acute upper respiratory infection, unspecified: Secondary | ICD-10-CM | POA: Diagnosis not present

## 2023-03-18 DIAGNOSIS — R519 Headache, unspecified: Secondary | ICD-10-CM

## 2023-03-18 LAB — POCT INFLUENZA A/B
Influenza A, POC: NEGATIVE
Influenza B, POC: NEGATIVE

## 2023-03-18 LAB — POC COVID19 BINAXNOW: SARS Coronavirus 2 Ag: NEGATIVE

## 2023-03-18 MED ORDER — ALBUTEROL SULFATE HFA 108 (90 BASE) MCG/ACT IN AERS: 2.0000 | INHALATION_SPRAY | Freq: Four times a day (QID) | RESPIRATORY_TRACT | 0 refills | Status: DC | PRN

## 2023-03-18 MED ORDER — BENZONATATE 100 MG PO CAPS: 100.0000 mg | ORAL_CAPSULE | Freq: Three times a day (TID) | ORAL | 0 refills | Status: DC | PRN

## 2023-03-18 NOTE — Patient Instructions (Signed)
Symptomatic treatment.

## 2023-03-18 NOTE — Progress Notes (Signed)
Subjective:     Patient ID: Rhonda [name], female    DOB: 1975-08-30, 48 y.o.   MRN: [id number]  Chief Complaint  Patient presents with   Cough    Cough with wheezing that started over the weekend, wheezing worse on yesterday   Headache    Took ibuprofen yesterday    HPI Discussed the use of AI scribe software for clinical note transcription with the patient, who gave verbal consent to proceed.  History of Present Illness   Rhonda [name] is a 48 year old female who presents with a cough and fever.  She developed a cough on Friday, initially feeling tired and attributing it to working hard over the weekend. She slept from 4 PM on Friday until the next day. By Saturday and Sunday, she experienced severe coughing, described as 'cough pain', but denies any chest pain. Some improvement was noted on Monday, but symptoms worsened in the evening with the onset of fever and wheezing, making it difficult to lie down. As of today, she feels slightly better than last night.  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 She has not checked her temperature but describes feeling cold followed by 'burning up'. No body aches, but she mentions occasional pain in her feet and a general feeling of her body hurting.  She reports slight congestion and minimal sputum production. No history of asthma and is generally healthy, not taking any regular medications or having any known allergies. She mentions her blood pressure was noted to be a little high recently. She has taken some over-the-counter medication for her symptoms but did not take any last night or today.       There are no preventive care reminders to display for this patient.  History reviewed. No pertinent past medical history.  History reviewed. No pertinent surgical  history.   Current Outpatient Medications:    albuterol (VENTOLIN HFA) 108 (90 Base) MCG/ACT inhaler, Inhale 2 puffs into the lungs every 6 (six) hours as needed for wheezing or shortness of breath., Disp: 8 g, Rfl: 0   benzonatate (TESSALON PERLES) 100 MG capsule, Take 1 capsule (100 mg total) by mouth 3 (three) times daily as needed., Disp: 20 capsule, Rfl: 0  No Known Allergies ROS neg/noncontributory except as noted HPI/below      Objective:     BP 120/80 (BP Location: Left Arm, Patient Position: Sitting, Cuff Size: Normal)   Pulse 71   Temp 98.2 F (36.8 C) (Temporal)   Resp 18   Ht 5\' 1"  (1.549 m)   Wt 165 lb 2 oz (74.9 kg)   LMP  (LMP Unknown)   SpO2 98%   BMI 31.20 kg/m  Wt Readings from Last 3 Encounters:  03/18/23 165 lb 2 oz (74.9 kg)  03/03/23 165 lb (74.8 kg)  01/10/22 166 lb 8 oz (75.5 kg)    Physical Exam   Gen: WDWN NAD HEENT: NCAT, conjunctiva not injected, sclera nonicteric TM not visualized d/t cerumen B, OP moist, no exudates.  Some congestioin NECK:  supple, no thyromegaly, no nodes,  CARDIAC: RRR, S1S2+, no murmur.  LUNGS: CTAB. No wheezes EXT:  no edema MSK: no gross abnormalities.  NEURO: A&O x3.  CN II-XII intact.  PSYCH: normal mood. Good eye contact  Results for orders placed or performed  in visit on 03/18/23  POC COVID-19   Collection Time: 03/18/23  3:13 PM  Result Value Ref Range   SARS Coronavirus 2 Ag Negative Negative  POCT Influenza A/B   Collection Time: 03/18/23  3:13 PM  Result Value Ref Range   Influenza A, POC Negative Negative   Influenza B, POC Negative Negative       Assessment & Plan:  Acute upper respiratory infection  Cough, unspecified type -     POC COVID-19 BinaxNow -     POCT Influenza A/B  Nonintractable headache, unspecified chronicity pattern, unspecified headache type -     POC COVID-19 BinaxNow -     POCT Influenza A/B  Other orders -     Benzonatate; Take 1 capsule (100 mg total) by mouth 3  (three) times daily as needed.  Dispense: 20 capsule; Refill: 0 -     Albuterol Sulfate HFA; Inhale 2 puffs into the lungs every 6 (six) hours as needed for wheezing or shortness of breath.  Dispense: 8 g; Refill: 0  Assessment and Plan   Assessment and Plan    Acute Respiratory Infection Cough since Friday with fever, wheezing, and congestion. No chest pain or dyspnea. Possible exposure to influenza, COVID-19, or other viral infections at work. Differential includes influenza, COVID-19, RSV, and other viral respiratory infections. Lungs are clear on examination. Discussed flu and COVID-19 testing due to prevalence. Outside window for Tamiflu if influenza. Discussed other viral infections.  flu and COVID-19 tests neg.   Follow-up Follow up with results of flu and COVID-19 tests.              Return if symptoms worsen or fail to improve.  Angelena Sole, MD

## 2023-04-09 ENCOUNTER — Other Ambulatory Visit: Payer: Self-pay | Admitting: Family Medicine

## 2023-04-09 NOTE — Telephone Encounter (Signed)
 Patient stated that she did not request refill, does not need medication.

## 2023-04-30 ENCOUNTER — Encounter: Payer: Self-pay | Admitting: Family Medicine

## 2023-04-30 ENCOUNTER — Ambulatory Visit (INDEPENDENT_AMBULATORY_CARE_PROVIDER_SITE_OTHER): Admitting: Family Medicine

## 2023-04-30 VITALS — BP 112/70 | HR 69 | Temp 98.4°F | Ht 61.0 in | Wt 154.4 lb

## 2023-04-30 DIAGNOSIS — H6993 Unspecified Eustachian tube disorder, bilateral: Secondary | ICD-10-CM

## 2023-04-30 DIAGNOSIS — H6122 Impacted cerumen, left ear: Secondary | ICD-10-CM

## 2023-04-30 DIAGNOSIS — H6503 Acute serous otitis media, bilateral: Secondary | ICD-10-CM

## 2023-04-30 MED ORDER — FLUTICASONE PROPIONATE 50 MCG/ACT NA SUSP: 1.0000 | Freq: Every day | NASAL | 2 refills | Status: DC

## 2023-04-30 NOTE — Progress Notes (Signed)
 Subjective  CC:  Chief Complaint  Patient presents with   ear itching    And can not hear clear    HPI: Rhonda [name] is a 48 y.o. female who presents to the office today to address the problems listed above in the chief complaint. Discussed the use of AI scribe software for clinical note transcription with the patient, who gave verbal consent to proceed.  History of Present Illness   Rhonda [name] is a 48 year old female who presents with ear congestion and hearing difficulties.  She has been experiencing ear congestion and hearing difficulties for the past two to three weeks. The sensation is described as feeling like fullness in her ears, and she suspects it may be due to cerumen impaction. She has used Debrox drops to soften the wax, which has made it softer but has not resolved the issue.  No pain, allergy symptoms, postnasal drip, sneezing, or nasal congestion. She had a respiratory illness last month, which may be contributing to her current symptoms. She feels pressure and decreased hearing, particularly in one ear, which she describes as 'plugged'.  Assessment  1. Non-recurrent acute serous otitis media of both ears   2. ETD (Eustachian tube dysfunction), bilateral   3. Impacted cerumen of left ear      Plan  Assessment and Plan    Bilateral serous otitis media and Eustachian Tube Dysfunction Decreased hearing and fullness due to residual middle ear fluid post-respiratory illness. No pain or acute infection signs.  - Prescribed Flonase nasal spray once daily for several months. - Instructed on proper Flonase use: spray straight back, avoid inhaling. - Recommended holding nose and blowing gently to open Eustachian tube. - Consider Zyrtec if congestion noted.  Cerumen Impaction Decreased hearing due to cerumen impaction in one ear. Debrox drops used without significant improvement. - Performed ear irrigation to remove cerumen from both ears.      No orders of the  defined types were placed in this encounter.  Meds ordered this encounter  Medications   fluticasone (FLONASE) 50 MCG/ACT nasal spray    Sig: Place 1 spray into both nostrils daily.    Dispense:  16 g    Refill:  2     I reviewed the patients updated PMH, FH, and SocHx.    Patient Active Problem List   Diagnosis Date Noted   SORE THROAT 03/23/2009   Acute upper respiratory infection 03/16/2009   Current Meds  Medication Sig   fluticasone (FLONASE) 50 MCG/ACT nasal spray Place 1 spray into both nostrils daily.    Allergies: Patient has no known allergies. Family History: Patient family history includes Diabetes in her mother; Hypertension in her father and mother. Social History:  Patient  reports that she has quit smoking. Her smoking use included cigarettes. She has never used smokeless tobacco. She reports current alcohol use of about 4.0 standard drinks of alcohol per week. She reports that she does not use drugs.  Review of Systems: Constitutional: Negative for fever malaise or anorexia Cardiovascular: negative for chest pain Respiratory: negative for SOB or persistent cough Gastrointestinal: negative for abdominal pain  Objective  Vitals: BP 112/70   Pulse 69   Temp 98.4 F (36.9 C)   Ht 5\' 1"  (1.549 m)   Wt 154 lb 6.4 oz (70 kg)   SpO2 97%   BMI 29.17 kg/m  General: no acute distress , A&Ox3 HEENT: PEERL, conjunctiva normal, neck is supple, no sinus ttp. Right TM with  serous effusion, nl landmarks; left with cerumen impaction cleared with irrigation: serous effusion present  Commons side effects, risks, benefits, and alternatives for medications and treatment plan prescribed today were discussed, and the patient expressed understanding of the given instructions. Patient is instructed to call or message via MyChart if he/she has any questions or concerns regarding our treatment plan. No barriers to understanding were identified. We discussed Red Flag symptoms and  signs in detail. Patient expressed understanding regarding what to do in case of urgent or emergency type symptoms.  Medication list was reconciled, printed and provided to the patient in AVS. Patient instructions and summary information was reviewed with the patient as documented in the AVS. This note was prepared with assistance of Dragon voice recognition software. Occasional wrong-word or sound-a-like substitutions may have occurred due to the inherent limitations of voice recognition software

## 2023-04-30 NOTE — Patient Instructions (Signed)
 Please follow up if symptoms do not improve or as needed.    VISIT SUMMARY:  Today, we discussed your ear congestion and hearing difficulties that have been ongoing for the past two to three weeks. You mentioned a sensation of 'blood' in your ears and suspected it might be due to earwax buildup. We also reviewed your recent respiratory illness and its potential impact on your symptoms.  YOUR PLAN:  -EUSTACHIAN TUBE DYSFUNCTION: Eustachian Tube Dysfunction occurs when the tube connecting the middle ear to the back of the nose doesn't open properly, causing fluid buildup and pressure. This can happen after a respiratory illness. We have prescribed Flonase nasal spray to be used once daily for several months to help reduce inflammation and fluid. Please spray it straight back and avoid inhaling it. Additionally, you can try holding your nose and blowing gently to help open the Eustachian tube. If you notice any congestion, you may consider taking Zyrtec.  -CERUMEN IMPACTION: Cerumen impaction is a buildup of earwax that can block the ear canal and affect hearing. We performed ear irrigation today to remove the earwax from both ears. This should help improve your hearing.  INSTRUCTIONS:  Please follow up with [place] if your symptoms do not improve or if you experience any new symptoms. Continue using the Flonase nasal spray as directed and consider Zyrtec if you notice congestion. If you have any questions or concerns, do not hesitate to contact our office.  Eustachian Tube Dysfunction  Eustachian tube dysfunction refers to a condition in which a blockage develops in the narrow passage that connects the middle ear to the back of the nose (eustachian tube). The eustachian tube regulates air pressure in the middle ear by letting air move between the ear and nose. It also helps to drain fluid from the middle ear space. Eustachian tube dysfunction can affect one or both ears. When the eustachian tube does not  function properly, air pressure, fluid, or both can build up in the middle ear. What are the causes? This condition occurs when the eustachian tube becomes blocked or cannot open normally. Common causes of this condition include: Ear infections. Colds and other infections that affect the nose, mouth, and throat (upper respiratory tract). Allergies. Irritation from cigarette smoke. Irritation from stomach acid coming up into the esophagus (gastroesophageal reflux). The esophagus is the part of the body that moves food from the mouth to the stomach. Sudden changes in air pressure, such as from descending in an airplane or scuba diving. Abnormal growths in the nose or throat, such as: Growths that line the nose (nasal polyps). Abnormal growth of cells (tumors). Enlarged tissue at the back of the throat (adenoids). What increases the risk? You are more likely to develop this condition if: You smoke. You are overweight. You are a child who has: Certain birth defects of the mouth, such as cleft palate. Large tonsils or adenoids. What are the signs or symptoms? Common symptoms of this condition include: A feeling of fullness in the ear. Ear pain. Clicking or popping noises in the ear. Ringing in the ear (tinnitus). Hearing loss. Loss of balance. Dizziness. Symptoms may get worse when the air pressure around you changes, such as when you travel to an area of high elevation, fly on an airplane, or go scuba diving. How is this diagnosed? This condition may be diagnosed based on: Your symptoms. A physical exam of your ears, nose, and throat. Tests, such as those that measure: The movement of your  eardrum. Your hearing (audiometry). How is this treated? Treatment depends on the cause and severity of your condition. In mild cases, you may relieve your symptoms by moving air into your ears. This is called "popping the ears." In more severe cases, or if you have symptoms of fluid in your  ears, treatment may include: Medicines to relieve congestion (decongestants). Medicines that treat allergies (antihistamines). Nasal sprays or ear drops that contain medicines that reduce swelling (steroids). A procedure to drain the fluid in your eardrum. In this procedure, a small tube may be placed in the eardrum to: Drain the fluid. Restore the air in the middle ear space. A procedure to insert a balloon device through the nose to inflate the opening of the eustachian tube (balloon dilation). Follow these instructions at home: Lifestyle Do not do any of the following until your health care provider approves: Travel to high altitudes. Fly in airplanes. Work in a Estate agent or room. Scuba dive. Do not use any products that contain nicotine or tobacco. These products include cigarettes, chewing tobacco, and vaping devices, such as e-cigarettes. If you need help quitting, ask your health care provider. Keep your ears dry. Wear fitted earplugs during showering and bathing. Dry your ears completely after. General instructions Take over-the-counter and prescription medicines only as told by your health care provider. Use techniques to help pop your ears as recommended by your health care provider. These may include: Chewing gum. Yawning. Frequent, forceful swallowing. Closing your mouth, holding your nose closed, and gently blowing as if you are trying to blow air out of your nose. Keep all follow-up visits. This is important. Contact a health care provider if: Your symptoms do not go away after treatment. Your symptoms come back after treatment. You are unable to pop your ears. You have: A fever. Pain in your ear. Pain in your head or neck. Fluid draining from your ear. Your hearing suddenly changes. You become very dizzy. You lose your balance. Get help right away if: You have a sudden, severe increase in any of your symptoms. Summary Eustachian tube dysfunction refers  to a condition in which a blockage develops in the eustachian tube. It can be caused by ear infections, allergies, inhaled irritants, or abnormal growths in the nose or throat. Symptoms may include ear pain or fullness, hearing loss, or ringing in the ears. Mild cases are treated with techniques to unblock the ears, such as yawning or chewing gum. More severe cases are treated with medicines or procedures. This information is not intended to replace advice given to you by your health care provider. Make sure you discuss any questions you have with your health care provider. Document Revised: 04/03/2020 Document Reviewed: 04/03/2020 Elsevier Patient Education  2024 [employer].

## 2023-07-02 ENCOUNTER — Ambulatory Visit: Admitting: Family Medicine

## 2023-07-02 ENCOUNTER — Ambulatory Visit: Payer: Self-pay | Admitting: Family Medicine

## 2023-07-02 ENCOUNTER — Encounter: Payer: Self-pay | Admitting: Family Medicine

## 2023-07-02 VITALS — BP 122/79 | HR 73 | Temp 98.3°F | Resp 18 | Ht 61.0 in | Wt 156.1 lb

## 2023-07-02 DIAGNOSIS — E7800 Pure hypercholesterolemia, unspecified: Secondary | ICD-10-CM | POA: Diagnosis not present

## 2023-07-02 DIAGNOSIS — J069 Acute upper respiratory infection, unspecified: Secondary | ICD-10-CM

## 2023-07-02 LAB — LIPID PANEL
Cholesterol: 195 mg/dL (ref 0–200)
HDL: 53.3 mg/dL (ref >39.00)
LDL Cholesterol: 125 mg/dL — ABNORMAL HIGH (ref 0–99)
NonHDL: 141.78
Total CHOL/HDL Ratio: 4
Triglycerides: 86 mg/dL (ref 0.0–149.0)
VLDL: 17.2 mg/dL (ref 0.0–40.0)

## 2023-07-02 MED ORDER — AZITHROMYCIN 250 MG PO TABS: ORAL_TABLET | ORAL | 0 refills | Status: AC

## 2023-07-02 NOTE — Patient Instructions (Addendum)
 It was very nice to see you today!  Rhinocort, nasacort nose sprays   PLEASE NOTE:  If you had any lab tests please let us  know if you have not heard back within a few days. You may see your results on MyChart before we have a chance to review them but we will give you a call once they are reviewed by us . If we ordered any referrals today, please let us  know if you have not heard from their office within the next week.   Please try these tips to maintain a healthy lifestyle:  Eat most of your calories during the day when you are active. Eliminate processed foods including packaged sweets (pies, cakes, cookies), reduce intake of potatoes, white bread, white pasta, and white rice. Look for whole grain options, oat flour or almond flour.  Each meal should contain half fruits/vegetables, one quarter protein, and one quarter carbs (no bigger than a computer mouse).  Cut down on sweet beverages. This includes juice, soda, and sweet tea. Also watch fruit intake, though this is a healthier sweet option, it still contains natural sugar! Limit to 3 servings daily.  Drink at least 1 glass of water with each meal and aim for at least 8 glasses per day  Exercise at least 150 minutes every week.

## 2023-07-02 NOTE — Progress Notes (Signed)
 Better.  Continue diet/exercise

## 2023-07-02 NOTE — Progress Notes (Signed)
 Subjective:     Patient ID: Rhonda [name], female    DOB: 12-02-1975, 48 y.o.   MRN: [id number]  Chief Complaint  Patient presents with   Cough    Lingering cough that started over 1 week ago, taking OTC meds with no relief    Hyperlipidemia    Follow-up on elevated cholesterol Fasting for blood work     HPI Discussed the use of AI scribe software for clinical note transcription with the patient, who gave verbal consent to proceed.  History of Present Illness Rhonda [name] is a 48 year old female who presents with a persistent cough and concerns about her cholesterol levels.  She has been experiencing a persistent dry cough for about a week, accompanied by a tickling sensation in her throat. Initially, there was an increase in sputum production, which has since improved, although the cough persists. No shortness of breath, fever, or chills. .  She has a history of elevated cholesterol and is currently working on dietary changes and exercise to manage it. She is trying to eat less and has started exercising, but back pain limits her ability to exercise. She has managed to do some chiropractic adjustments four times, which has slightly improved her back pain.  She reports a runny nose and has previously used a nasal spray, which caused sneezing. She is unsure if the sneezing was due to the spray or another cause, so she discontinued its use. She has a history of ear issues and has been to a different doctor for this concern. She is uncertain if fluid is still present in her ears.  She works at a school, which involves a lot of bending, potentially contributing to her back pain. She has a family history of diabetes.    There are no preventive care reminders to display for this patient.  History reviewed. No pertinent past medical history.  History reviewed. No pertinent surgical history.   Current Outpatient Medications:    azithromycin  (ZITHROMAX ) 250 MG tablet, Take 2 tablets on  day 1, then 1 tablet daily on days 2 through 5, Disp: 6 tablet, Rfl: 0   fluticasone  (FLONASE ) 50 MCG/ACT nasal spray, Place 1 spray into both nostrils daily. (Patient not taking: Reported on 07/02/2023), Disp: 16 g, Rfl: 2  No Known Allergies ROS neg/noncontributory except as noted HPI/below      Objective:      BP 122/79   Pulse 73   Temp 98.3 F (36.8 C) (Temporal)   Resp 18   Ht 5\' 1"  (1.549 m)   Wt 156 lb 2 oz (70.8 kg)   LMP 06/29/2023 (Approximate)   SpO2 98%   BMI 29.50 kg/m  Wt Readings from Last 3 Encounters:  07/02/23 156 lb 2 oz (70.8 kg)  04/30/23 154 lb 6.4 oz (70 kg)  03/18/23 165 lb 2 oz (74.9 kg)    Physical Exam   Gen: WDWN NAD HEENT: NCAT, conjunctiva not injected, sclera nonicteric TM WNL L, R dull and some [name] fluid, OP moist, no exudates but red NECK:  supple, no thyromegaly, no nodes,  CARDIAC: RRR, S1S2+, no murmur. LUNGS: CTAB. No wheezes EXT:  no edema MSK: no gross abnormalities.  NEURO: A&O x3.  CN II-XII intact.  PSYCH: normal mood. Good eye contact     Assessment & Plan:  Pure hypercholesterolemia -     Lipid panel  Acute upper respiratory infection  Other orders -     Azithromycin ; Take 2 tablets on day 1,  then 1 tablet daily on days 2 through 5  Dispense: 6 tablet; Refill: 0  Assessment and Plan Assessment & Plan Cough with throat redness   She has a persistent dry, tickling cough for over a week with throat redness. There is no dyspnea, fever, or chills. Fluid is present in the ears, throat examination shows redness, and lungs are [name]. The differential includes viral or bacterial infection. Due to symptom persistence and throat redness, antibiotics are chosen. Prescribe azithromycin  (Z-Pak) for suspected bacterial infection. Advise follow-up in two-3 weeks to reassess ear   ?ETD vs recurrent ROM-on zpk.  Re-eval.  Consider rhinocort or nasocort as flonase  makes her sneeze  Back pain   Back pain affects her exercise  ability, though there is slight improvement from cardio exercises. Discussed modifying her exercise routine to accommodate the pain, suggesting shorter activity durations as needed. Encourage continuation of the modified exercise routine, such as walking for shorter durations as tolerated.  HLD-working on diet and some exercise.  reck    Return in about 3 weeks (around 07/23/2023) for annual physical-in January.     ear reck.  Ellsworth Haas, MD

## 2023-07-22 ENCOUNTER — Ambulatory Visit (INDEPENDENT_AMBULATORY_CARE_PROVIDER_SITE_OTHER): Admitting: Family Medicine

## 2023-07-22 ENCOUNTER — Encounter: Payer: Self-pay | Admitting: Family Medicine

## 2023-07-22 VITALS — BP 126/85 | HR 83 | Temp 98.4°F | Resp 18 | Ht 61.0 in | Wt 154.2 lb

## 2023-07-22 DIAGNOSIS — H9201 Otalgia, right ear: Secondary | ICD-10-CM

## 2023-07-22 DIAGNOSIS — R0981 Nasal congestion: Secondary | ICD-10-CM | POA: Diagnosis not present

## 2023-07-22 NOTE — Patient Instructions (Addendum)
 It was very nice to see you today!  Finish the bottle of nose spray.  If still problems let me know and will refer to ENT  Deviated septum to the right.   Possible some nasal polyps   PLEASE NOTE:  If you had any lab tests please let us  know if you have not heard back within a few days. You may see your results on MyChart before we have a chance to review them but we will give you a call once they are reviewed by us . If we ordered any referrals today, please let us  know if you have not heard from their office within the next week.   Please try these tips to maintain a healthy lifestyle:  Eat most of your calories during the day when you are active. Eliminate processed foods including packaged sweets (pies, cakes, cookies), reduce intake of potatoes, white bread, white pasta, and white rice. Look for whole grain options, oat flour or almond flour.  Each meal should contain half fruits/vegetables, one quarter protein, and one quarter carbs (no bigger than a computer mouse).  Cut down on sweet beverages. This includes juice, soda, and sweet tea. Also watch fruit intake, though this is a healthier sweet option, it still contains natural sugar! Limit to 3 servings daily.  Drink at least 1 glass of water with each meal and aim for at least 8 glasses per day  Exercise at least 150 minutes every week.

## 2023-07-22 NOTE — Progress Notes (Signed)
   Subjective:     Patient ID: Rhonda Rhonda [name], female    DOB: 04-04-1975, 48 y.o.   MRN: [id number]  Chief Complaint  Patient presents with   Follow-up    Follow-up on fluid in ears, still having some pain and runny nose     HPI Discussed the use of AI scribe software for clinical note transcription with the patient, who gave verbal consent to proceed.  History of Present Illness Rhonda Rhonda [name] is a 48 year old female who presents for f/u  right ear pain and persistent right-sided nasal drainage.  She has been experiencing intermittent right ear pain for the past several weeks. She has been using a nasal spray, either Rhinocort or Nasacort, during this time. She completed a course of Z-Pak but is unsure if it alleviated her symptoms as she did not have pain prior to its use. She was previously informed of fluid in her ear and is concerned about the possibility of needing to see ENT.  She describes persistent Rhonda [name] nasal drainage from the right side, which has been ongoing since the onset of her symptoms. No sore throat currently.     There are no preventive care reminders to display for this patient.  History reviewed. No pertinent past medical history.  History reviewed. No pertinent surgical history.   Current Outpatient Medications:    fluticasone  (FLONASE ) 50 MCG/ACT nasal spray, Place 1 spray into both nostrils daily., Disp: 16 g, Rfl: 2  No Known Allergies ROS neg/noncontributory except as noted HPI/below      Objective:     BP 126/85   Pulse 83   Temp 98.4 F (36.9 C) (Temporal)   Resp 18   Ht 5' 1 (1.549 m)   Wt 154 lb 4 oz (70 kg)   LMP 06/29/2023 (Approximate)   HC 18 (45.7 cm)   SpO2 97%   BMI 29.15 kg/m  Wt Readings from Last 3 Encounters:  07/22/23 154 lb 4 oz (70 kg)  07/02/23 156 lb 2 oz (70.8 kg)  04/30/23 154 lb 6.4 oz (70 kg)    Physical Exam   Gen: WDWN NAD HEENT: NCAT, conjunctiva not injected, sclera nonicteric TM WNL L,   R TM scarred,  dull.  OP moist, no exudates.  Nares-deviated septum to R.  ?polyps B EXT:  no edema MSK: no gross abnormalities.  NEURO: A&O x3.  CN II-XII intact.  PSYCH: normal mood. Good eye contact     Assessment & Plan:  Nasal congestion  Right ear pain  Assessment and Plan Assessment & Plan Right ear pain with nasal obstruction   Intermittent right ear pain with fluid in the ear is likely due to scarring from past infections. Persistent Rhonda [name] rhinorrhea on the right side is noted. Examination shows a deviated septum and possible nasal polyps, contributing to nasal obstruction and impaired drainage, causing ear pain. Nasal spray (Rhinocort or Nasacort) has been used, but symptoms persist. An ENT referral for further evaluation, including possible tympanostomy tubes, is discussed if the condition does not improve. ENT evaluation may include nasal endoscopy to assess for polyps and other obstructions. ENT appointment may take 6-8 weeks to schedule, so continue using nasal spray and finish the current bottle. If symptoms persist after completing the nasal spray, consider ENT referral. Follow-up if symptoms do not improve after completing the nasal spray.(Pt prefers to wait on referral)    Return if symptoms worsen or fail to improve.  Ellsworth Haas, MD

## 2023-07-23 ENCOUNTER — Ambulatory Visit (INDEPENDENT_AMBULATORY_CARE_PROVIDER_SITE_OTHER): Admitting: Family Medicine

## 2023-07-23 ENCOUNTER — Encounter: Payer: Self-pay | Admitting: Family Medicine

## 2023-07-23 VITALS — BP 131/88 | HR 84 | Temp 98.6°F | Resp 18 | Ht 61.0 in | Wt 154.2 lb

## 2023-07-23 DIAGNOSIS — N898 Other specified noninflammatory disorders of vagina: Secondary | ICD-10-CM | POA: Diagnosis not present

## 2023-07-23 MED ORDER — TERCONAZOLE 0.4 % VA CREA: 1.0000 | TOPICAL_CREAM | Freq: Every day | VAGINAL | 0 refills | Status: DC

## 2023-07-23 MED ORDER — VALACYCLOVIR HCL 1 G PO TABS: 1000.0000 mg | ORAL_TABLET | Freq: Two times a day (BID) | ORAL | 0 refills | Status: AC

## 2023-07-23 NOTE — Progress Notes (Signed)
 Subjective:     Patient ID: Rhonda [name], female    DOB: 1976-01-17, 48 y.o.   MRN: [id number]  Chief Complaint  Patient presents with   Bumps    Red bumps in vaginal area, noticed last night, burning    HPI Going to [place] Discussed the use of AI scribe software for clinical note transcription with the patient, who gave verbal consent to proceed.  History of Present Illness Rhonda [name] is a 49 year old female who presents with genital itching and burning after starting amoxicillin for a dental infection.  She experiences genital itching and burning, particularly after sexual intercourse, which makes urination painful-long time. The sensation is described as 'burning so bad' with small, itchy bumps in the genital area. These symptoms began after starting amoxicillin for a dental infection last pm, prescribed by her dentist following a visit for a swollen tooth(yesterday).  No history of similar symptoms and no fever. She has not noticed any symptoms in her husband and confirms that neither she nor her husband have other sexual partners. She last underwent a Pap smear in December.  She does not use condoms or lubrication during intercourse and has not changed any soaps or detergents recently. She mentions shaving the area last night and three to four days prior, which often causes burning and redness.  Her current medication includes amoxicillin, which she started yesterday for a dental infection.  No fever and no history of cold sores for herself or her husband.    There are no preventive care reminders to display for this patient.  History reviewed. No pertinent past medical history.  History reviewed. No pertinent surgical history.   Current Outpatient Medications:    amoxicillin (AMOXIL) 875 MG tablet, Take 875 mg by mouth 2 (two) times daily., Disp: , Rfl:    fluticasone  (FLONASE ) 50 MCG/ACT nasal spray, Place 1 spray into both nostrils daily., Disp: 16 g, Rfl: 2    terconazole (TERAZOL 7) 0.4 % vaginal cream, Place 1 applicator vaginally at bedtime., Disp: 45 g, Rfl: 0   valACYclovir (VALTREX) 1000 MG tablet, Take 1 tablet (1,000 mg total) by mouth 2 (two) times daily for 10 days., Disp: 20 tablet, Rfl: 0  No Known Allergies ROS neg/noncontributory except as noted HPI/below      Objective:     BP 131/88   Pulse 84   Temp 98.6 F (37 C) (Temporal)   Resp 18   Ht 5' 1 (1.549 m)   Wt 154 lb 4 oz (70 kg)   LMP 06/29/2023 (Approximate)   SpO2 95%   BMI 29.15 kg/m  Wt Readings from Last 3 Encounters:  07/23/23 154 lb 4 oz (70 kg)  07/22/23 154 lb 4 oz (70 kg)  07/02/23 156 lb 2 oz (70.8 kg)    Physical Exam Genitourinary:     Comments: Tiny bumps at introitus.  Irrit and redness w/leading border outlined.  shaves     Gen: WDWN NAD HEENT: NCAT, conjunctiva not injected, sclera nonicteric EXT:  no edema MSK: no gross abnormalities.  NEURO: A&O x3.  CN II-XII intact.  PSYCH: normal mood. Good eye contact      Assessment & Plan:  Vaginal lesion -     Herpes simplex virus culture  Other orders -     Terconazole; Place 1 applicator vaginally at bedtime.  Dispense: 45 g; Refill: 0 -     valACYclovir HCl; Take 1 tablet (1,000 mg total) by mouth 2 (two) times daily  for 10 days.  Dispense: 20 tablet; Refill: 0  Assessment and Plan Assessment & Plan Vulvar Irritation and Bumps   She presents with vulvar irritation, burning, and small red bumps. Differential diagnosis includes irritation from shaving, yeast infection, or herpes simplex virus. No fever and denies new sexual partners. Likely shaving irritation, but herpes cannot be ruled out without further testing. She is traveling to [place] for a month and a half, complicating follow-up. Informed consent obtained regarding potential need for herpes treatment abroad. Risks of herpes include recurrent outbreaks and transmission, but likelihood is low given her sexual history. Benefits of  early Valacyclovir treatment include reducing severity and duration of symptoms if herpes is confirmed. Valacyclovir prescribed to ensure access to treatment if symptoms worsen abroad. Prescribe vaginal cream for yeast infection-terazol 7, apply once daily, especially at night, to the entire affected area including around the rectum. Advise against shaving for one week to reduce irritation. Instruct to use the cream for seven days, may stop after three days if symptoms improve. Advise to check luggage for the cream due to size restrictions. Discuss potential use of WhatsApp for communication regarding test results while in [place].  Tooth Infection   She has a tooth infection with swelling in the affected area. Continue amoxicillin as prescribed by the dentist.    Return if symptoms worsen or fail to improve.  Ellsworth Haas, MD

## 2023-07-23 NOTE — Patient Instructions (Addendum)
 Cream once/day and put on outside as well. Can quit after 3 days if better  Pills only if a lot worse or if culture positive  No shaving for 1 week.

## 2023-07-24 ENCOUNTER — Telehealth: Payer: Self-pay | Admitting: Family Medicine

## 2023-07-24 NOTE — Telephone Encounter (Signed)
 Received call from Rhonda [name] from [place] . He has questions regarding lab collection from 07/23/23 . ( Needs to know site of collection ) .   Call back number is [phone]

## 2023-07-24 NOTE — Telephone Encounter (Signed)
 Returned call and confirmed where sample was taking from.Rhonda [name]

## 2023-07-25 LAB — TIQ- AMBIGUOUS ORDER

## 2023-07-29 ENCOUNTER — Ambulatory Visit: Payer: Self-pay | Admitting: Family Medicine

## 2023-07-29 LAB — HERPES SIMPLEX VIRUS CULTURE
MICRO NUMBER:: 16596158
SPECIMEN QUALITY:: ADEQUATE

## 2023-07-29 NOTE — Progress Notes (Signed)
 Herpes was negative.  If recurs, let me know

## 2023-12-16 ENCOUNTER — Ambulatory Visit: Payer: Self-pay | Admitting: Family Medicine

## 2023-12-16 ENCOUNTER — Ambulatory Visit (INDEPENDENT_AMBULATORY_CARE_PROVIDER_SITE_OTHER): Admitting: Family Medicine

## 2023-12-16 ENCOUNTER — Encounter: Payer: Self-pay | Admitting: Family Medicine

## 2023-12-16 VITALS — BP 128/74 | HR 86 | Temp 98.4°F | Ht 61.0 in | Wt 165.1 lb

## 2023-12-16 DIAGNOSIS — N939 Abnormal uterine and vaginal bleeding, unspecified: Secondary | ICD-10-CM | POA: Diagnosis not present

## 2023-12-16 DIAGNOSIS — E7800 Pure hypercholesterolemia, unspecified: Secondary | ICD-10-CM | POA: Diagnosis not present

## 2023-12-16 LAB — COMPREHENSIVE METABOLIC PANEL WITH GFR
ALT: 17 U/L (ref 0–35)
AST: 18 U/L (ref 0–37)
Albumin: 4.6 g/dL (ref 3.5–5.2)
Alkaline Phosphatase: 74 U/L (ref 39–117)
BUN: 11 mg/dL (ref 6–23)
CO2: 29 meq/L (ref 19–32)
Calcium: 9.2 mg/dL (ref 8.4–10.5)
Chloride: 103 meq/L (ref 96–112)
Creatinine, Ser: 0.71 mg/dL (ref 0.40–1.20)
GFR: 100.51 mL/min (ref >60.00)
Glucose, Bld: 86 mg/dL (ref 70–99)
Potassium: 3.6 meq/L (ref 3.5–5.1)
Sodium: 141 meq/L (ref 135–145)
Total Bilirubin: 0.8 mg/dL (ref 0.2–1.2)
Total Protein: 6.8 g/dL (ref 6.0–8.3)

## 2023-12-16 LAB — LIPID PANEL
Cholesterol: 205 mg/dL — ABNORMAL HIGH (ref 0–200)
HDL: 51.4 mg/dL (ref >39.00)
LDL Cholesterol: 113 mg/dL — ABNORMAL HIGH (ref 0–99)
NonHDL: 153.74
Total CHOL/HDL Ratio: 4
Triglycerides: 202 mg/dL — ABNORMAL HIGH (ref 0.0–149.0)
VLDL: 40.4 mg/dL — ABNORMAL HIGH (ref 0.0–40.0)

## 2023-12-16 LAB — CBC WITH DIFFERENTIAL/PLATELET
Basophils Absolute: 0 K/uL (ref 0.0–0.1)
Basophils Relative: 0.5 % (ref 0.0–3.0)
Eosinophils Absolute: 0.2 K/uL (ref 0.0–0.7)
Eosinophils Relative: 1.8 % (ref 0.0–5.0)
HCT: 40.2 % (ref 36.0–46.0)
Hemoglobin: 14 g/dL (ref 12.0–15.0)
Lymphocytes Relative: 23.6 % (ref 12.0–46.0)
Lymphs Abs: 2 K/uL (ref 0.7–4.0)
MCHC: 34.8 g/dL (ref 30.0–36.0)
MCV: 91.7 fl (ref 78.0–100.0)
Monocytes Absolute: 0.5 K/uL (ref 0.1–1.0)
Monocytes Relative: 5.5 % (ref 3.0–12.0)
Neutro Abs: 5.8 K/uL (ref 1.4–7.7)
Neutrophils Relative %: 68.6 % (ref 43.0–77.0)
Platelets: 266 K/uL (ref 150.0–400.0)
RBC: 4.39 Mil/uL (ref 3.87–5.11)
RDW: 12.6 % (ref 11.5–15.5)
WBC: 8.4 K/uL (ref 4.0–10.5)

## 2023-12-16 LAB — TSH: TSH: 0.85 u[IU]/mL (ref 0.35–5.50)

## 2023-12-16 LAB — POCT URINE PREGNANCY: Preg Test, Ur: NEGATIVE

## 2023-12-16 MED ORDER — NORETHINDRONE ACETATE 5 MG PO TABS
ORAL_TABLET | ORAL | 0 refills | Status: AC
Start: 2023-12-16 — End: ?

## 2023-12-16 NOTE — Progress Notes (Signed)
 Subjective:     Patient ID: Rhonda [name], female    DOB: [birth year], 48 y.o.   MRN: [id number]  Chief Complaint  Patient presents with   Menorrhagia    Pt is here today to discuss her periods that are irregular this time she has been on it for 9 days    Discussed the use of AI scribe software for clinical note transcription with the patient, who gave verbal consent to proceed.  History of Present Illness Rhonda [name] is a 48 year old female who presents with irregular and prolonged menstrual bleeding.  She has been experiencing irregular and prolonged menstrual bleeding, with her current period lasting nine days, longer than usual. Last year, she had a three-month gap without menstruation, after which her periods resumed monthly. Her last period before the current one was on September 14, 2023, indicating a three-month interval without menstruation.  The current bleeding is not excessively heavy but more than spotting, with occasional clots. She needs to change her pad three to four times a day, which is more than she typically experiences. No abdominal pain is present, but she feels tired and lightheaded at times.  She is not using any form of birth control and has not taken a pregnancy test, although she and her partner are careful. She recalls a similar episode of irregular bleeding earlier in the year.  No family history of uterine cancers or blood clots. No chest pain or shortness of breath. She feels tired and lightheaded at times.    Health Maintenance Due  Topic Date Due   Mammogram  06/29/2015    History reviewed. No pertinent past medical history.  History reviewed. No pertinent surgical history.   Current Outpatient Medications:    norethindrone (AYGESTIN) 5 MG tablet, Take 1 tablet (5 mg total) by mouth 3 times per day, till bleeding stops or max 7 days, Disp: 21 tablet, Rfl: 0  No Known Allergies ROS neg/noncontributory except as noted HPI/below      Objective:      BP 128/74 (BP Location: Left Arm, Patient Position: Sitting, Cuff Size: Normal)   Pulse 86   Temp 98.4 F (36.9 C) (Temporal)   Ht 5' 1 (1.549 m)   Wt 165 lb 2 oz (74.9 kg)   LMP 12/07/2023 (Exact Date)   SpO2 99%   BMI 31.20 kg/m  Wt Readings from Last 3 Encounters:  12/16/23 165 lb 2 oz (74.9 kg)  07/23/23 154 lb 4 oz (70 kg)  07/22/23 154 lb 4 oz (70 kg)    Physical Exam GENERAL: Well developed, well nourished, no acute distress. HEAD EYES EARS NOSE THROAT: Normocephalic, atraumatic, conjunctiva not injected, sclera nonicteric. CARDIAC: Regular rate and rhythm, S1 S2 present, no murmur NECK: Supple, no thyromegaly, no nodes, no carotid bruits. LUNGS: Clear to auscultation bilaterally, no wheezes. ABDOMEN: Bowel sounds present, soft, non-tender, non-distended, no hepatosplenomegaly, no masses. EXTREMITIES: No edema. MUSCULOSKELETAL: No gross abnormalities. NEUROLOGICAL: Alert and oriented x3, cranial nerves II through XII intact. PSYCHIATRIC: Normal mood, good eye contact.  Results for orders placed or performed in visit on 12/16/23  POCT urine pregnancy   Collection Time: 12/16/23  1:48 PM  Result Value Ref Range   Preg Test, Ur Negative Negative   -nega      Assessment & Plan:  Vaginal bleeding -     POCT urine pregnancy -     CBC with Differential/Platelet -     Comprehensive metabolic panel with GFR -  TSH -     US  PELVIC COMPLETE WITH TRANSVAGINAL; Future  Pure hypercholesterolemia -     Lipid panel  Other orders -     Norethindrone Acetate; Take 1 tablet (5 mg total) by mouth 3 times per day, till bleeding stops or max 7 days  Dispense: 21 tablet; Refill: 0    Assessment and Plan Assessment & Plan Abnormal uterine bleeding in perimenopause   She experiences prolonged menstrual bleeding for nine days with occasional clots, more severe than previous periods. Differential diagnosis includes perimenopause, pregnancy, thyroid dysfunction, fibroids,  polyps, and endometrial cancer. Pregnancy must be ruled out before treatment-negative. She reports no abdominal pain or other systemic symptoms but notes lightheadedness. Previous periods were regular and not heavy. Ordered a pregnancy test and blood work, including thyroid function tests. A pelvic ultrasound is ordered to evaluate for fibroids, polyps, or other uterine abnormalities. Prescribed norethindrone 15 mg daily for five days, with the option to delay initiation. Sent prescription to pharmacy for potential future use.   HLD-working on diet/exercise-Ordered a cholesterol test.     Return in about 3 months (around 03/17/2024) for annual physical.  Jenkins CHRISTELLA Carrel, MD

## 2023-12-16 NOTE — Patient Instructions (Signed)
 It was very nice to see you today!  Keep in touch Take meds if continues    PLEASE NOTE:  If you had any lab tests please let us  know if you have not heard back within a few days. You may see your results on MyChart before we have a chance to review them but we will give you a call once they are reviewed by us . If we ordered any referrals today, please let us  know if you have not heard from their office within the next week.   Please try these tips to maintain a healthy lifestyle:  Eat most of your calories during the day when you are active. Eliminate processed foods including packaged sweets (pies, cakes, cookies), reduce intake of potatoes, white bread, white pasta, and white rice. Look for whole grain options, oat flour or almond flour.  Each meal should contain half fruits/vegetables, one quarter protein, and one quarter carbs (no bigger than a computer mouse).  Cut down on sweet beverages. This includes juice, soda, and sweet tea. Also watch fruit intake, though this is a healthier sweet option, it still contains natural sugar! Limit to 3 servings daily.  Drink at least 1 glass of water with each meal and aim for at least 8 glasses per day  Exercise at least 150 minutes every week.

## 2023-12-16 NOTE — Progress Notes (Signed)
 Labs are are normal Cholesterol better-keep working on it.  Triglycerides a little high-may have eaten prior to labs

## 2023-12-18 ENCOUNTER — Ambulatory Visit (HOSPITAL_BASED_OUTPATIENT_CLINIC_OR_DEPARTMENT_OTHER)
Admission: RE | Admit: 2023-12-18 | Discharge: 2023-12-18 | Disposition: A | Discharge status: Home / Self Care | Source: Ambulatory Visit | Attending: Family Medicine | Admitting: Family Medicine

## 2023-12-18 DIAGNOSIS — N939 Abnormal uterine and vaginal bleeding, unspecified: Secondary | ICD-10-CM | POA: Insufficient documentation

## 2023-12-22 ENCOUNTER — Other Ambulatory Visit: Payer: Self-pay

## 2023-12-22 ENCOUNTER — Telehealth: Payer: Self-pay

## 2023-12-22 DIAGNOSIS — N939 Abnormal uterine and vaginal bleeding, unspecified: Secondary | ICD-10-CM

## 2023-12-22 NOTE — Progress Notes (Signed)
 Called pt left message for return call smk

## 2023-12-22 NOTE — Telephone Encounter (Signed)
 Copied from CRM #[id number]. Topic: Clinical - Lab/Test Results >> Dec 22, 2023  3:21 PM [place] J wrote: Reason for CRM: Patient returning a call to [facility] for results. Please call back at [phone].

## 2024-01-09 ENCOUNTER — Ambulatory Visit: Payer: Self-pay

## 2024-01-09 NOTE — Telephone Encounter (Signed)
 1st attempt to reach patient. Left message with office call back number.   Message from [place] F sent at 01/09/2024  2:17 PM EST  Reason for Triage: Patient having cough and body aches - please call her at [phone] (M)

## 2024-01-09 NOTE — Telephone Encounter (Signed)
 2nd attempt. Left message with office callback number.

## 2024-01-09 NOTE — Telephone Encounter (Signed)
 Third attempt to reach patient for triage.  LVM for patient to return call to [phone]. Forwarded to [facility] [place] [facility] for follow up.   Message from [place] F sent at 01/09/2024  2:17 PM EST  Reason for Triage: Patient having cough and body aches - please call her at [phone] (M)

## 2024-01-19 ENCOUNTER — Ambulatory Visit: Payer: Self-pay

## 2024-01-19 NOTE — Telephone Encounter (Signed)
 FYI Only or Action Required?: FYI only for provider: appointment scheduled on 01/20/24.  Patient was last seen in primary care on 12/16/2023 by Wendolyn Jenkins Jansky, MD.  Called Nurse Triage reporting Cough.  Symptoms began several weeks ago.  Interventions attempted: OTC medications: NyQuil.  Symptoms are: stable.  Triage Disposition: See PCP When Office is Open (Within 3 Days)  Patient/caregiver understands and will follow disposition?: Yes                                  1. ONSET: When did the cough begin?     3 weeks, denies worsening 2. SEVERITY: How bad is the cough today?      Moderate, states cough is intermittent, states cough impacts sleep 3. SPUTUM: Describe the color of your sputum (e.g., none, dry cough; clear, white, yellow, green)     Yellow 4. HEMOPTYSIS: Are you coughing up any blood? If Yes, ask: How much? (e.g., flecks, streaks, tablespoons, etc.)     Denies 5. DIFFICULTY BREATHING: Are you having difficulty breathing? If Yes, ask: How bad is it? (e.g., mild, moderate, severe)      Denies, patient able to speak in clear and complete sentences while on phone with this RN, no labored breathing detected 6. FEVER: Do you have a fever? If Yes, ask: What is your temperature, how was it measured, and when did it start?     Denies 7. CARDIAC HISTORY: Do you have any history of heart disease? (e.g., heart attack, congestive heart failure)      Denies, per patient and chart 8. LUNG HISTORY: Do you have any history of lung disease?  (e.g., pulmonary embolus, asthma, emphysema)     Denies, per patient and chart 9. PE RISK FACTORS: Do you have a history of blood clots? (or: recent major surgery, recent prolonged travel, bedridden)     Family history of a blood clot (younger sister after delivering a baby), denies recent surgeries, denies prolonged travel  10. OTHER SYMPTOMS: Do you have any other symptoms? (e.g., runny  nose, wheezing, chest pain)     States she feels a very light/mild heaviness in her chest intermittently, denies additional symptoms at this time Denies NV, denies wheezing, denies sharp/intense/constant chest pain Reports chronic/ongoing joint/shoulder pain  Copied from CRM #[id number]. Topic: Clinical - Red Word Triage >> Jan 19, 2024 10:57 AM Avram MATSU wrote: Red Word that prompted transfer to Nurse Triage: cough that wont go away and feel like something in chest/pain/heavies not causing any discomfort  Reason for Disposition  Cough has been present for > 3 weeks  Protocols used: Cough - Acute Productive-A-AH

## 2024-01-19 NOTE — Telephone Encounter (Signed)
 Appt tomorrow

## 2024-01-20 ENCOUNTER — Encounter: Payer: Self-pay | Admitting: Family Medicine

## 2024-01-20 ENCOUNTER — Ambulatory Visit: Admitting: Family Medicine

## 2024-01-20 VITALS — BP 118/72 | HR 88 | Temp 98.2°F | Ht 61.0 in | Wt 167.4 lb

## 2024-01-20 DIAGNOSIS — J40 Bronchitis, not specified as acute or chronic: Secondary | ICD-10-CM

## 2024-01-20 MED ORDER — AZITHROMYCIN 250 MG PO TABS: ORAL_TABLET | ORAL | 0 refills | Status: AC

## 2024-01-20 MED ORDER — BENZONATATE 100 MG PO CAPS: 100.0000 mg | ORAL_CAPSULE | Freq: Two times a day (BID) | ORAL | 0 refills | Status: AC | PRN

## 2024-01-20 MED ORDER — ALBUTEROL SULFATE HFA 108 (90 BASE) MCG/ACT IN AERS: 2.0000 | INHALATION_SPRAY | Freq: Four times a day (QID) | RESPIRATORY_TRACT | 0 refills | Status: AC | PRN

## 2024-01-20 NOTE — Patient Instructions (Signed)
 [facility] [address] [place] [address] [phone]  If can't see you, call  [facility]

## 2024-01-20 NOTE — Progress Notes (Signed)
 Subjective:     Patient ID: Rhonda [name], female    DOB: 11-22-1975, 48 y.o.   MRN: [id number]  Chief Complaint  Patient presents with   Cough    Pt has had cough x2 weeks    Discussed the use of AI scribe software for clinical note transcription with the patient, who gave verbal consent to proceed.  History of Present Illness Rhonda [name] is a 48 year old female who presents with a persistent cough and chest discomfort.  She has been experiencing a persistent dry cough for a couple of weeks, particularly at night, with episodes lasting a few minutes. No fever, shortness of breath, vomiting, or diarrhea. Initially had a runny nose and congestion, which have resolved.  She has a history of using an inhaler, which was previously prescribed, but she has not used it during this illness and no longer possesses it. She recalls using Tessalon  Perles and antibiotics for similar symptoms in the past but is unsure about previous steroid use.  She expresses concern about a previousu/s and reports that she was told there were findings related to her uterus and ovary, but she is seeking clarification about these results. She has not yet received follow-up from the gynecologist regarding these findings. No medication allergies.    Health Maintenance Due  Topic Date Due   Mammogram  06/29/2015    History reviewed. No pertinent past medical history.  History reviewed. No pertinent surgical history.  Current Medications[1]  Allergies[2] ROS neg/noncontributory except as noted HPI/below      Objective:     BP 118/72 (BP Location: Left Arm, Patient Position: Sitting, Cuff Size: Normal)   Pulse 88   Temp 98.2 F (36.8 C) (Temporal)   Ht 5' 1 (1.549 m)   Wt 167 lb 6 oz (75.9 kg)   LMP 12/21/2023 (Approximate)   SpO2 98%   BMI 31.63 kg/m  Wt Readings from Last 3 Encounters:  01/20/24 167 lb 6 oz (75.9 kg)  12/16/23 165 lb 2 oz (74.9 kg)  07/23/23 154 lb 4 oz (70 kg)     Physical Exam GENERAL: Well developed, well nourished, no acute distress. HEAD EYES EARS NOSE THROAT: Normocephalic, atraumatic, conjunctiva not injected, sclera nonicteric, external auditory canals clear, pharynx normal. CARDIAC: Regular rate and rhythm, S1 S2 present, NECK: Supple, no thyromegaly, no cervical adenopathy, no carotid bruits. LUNGS: Clear to auscultation bilaterally, no wheezes. EXTREMITIES: No edema. MUSCULOSKELETAL: No gross abnormalities. NEUROLOGICAL: Alert and oriented x3, cranial nerves II through XII intact. PSYCHIATRIC: Normal mood, good eye contact.       Assessment & Plan:  Bronchitis  Other orders -     Albuterol  Sulfate HFA; Inhale 2 puffs into the lungs every 6 (six) hours as needed for wheezing or shortness of breath.  Dispense: 8 g; Refill: 0 -     Benzonatate ; Take 1 capsule (100 mg total) by mouth 2 (two) times daily as needed for cough.  Dispense: 20 capsule; Refill: 0 -     Azithromycin ; Take 2 tablets on day 1, then 1 tablet daily on days 2 through 5  Dispense: 6 tablet; Refill: 0    Assessment and Plan Assessment & Plan Bronchitis   She has a persistent dry cough for two weeks, mainly at night, with mild chest discomfort. There is no fever, shortness of breath, or gastrointestinal symptoms. Previous use of an albuterol  inhaler was beneficial. Current symptoms suggest bacterial bronchitis. Prescribe antibiotics for suspected bacterial bronchitis, Tessalon  Perles for cough,  and an albuterol  inhaler for cough and wheezing.  Uterine fibroids and adenomyosis   An intramural fibroid and possible adenomyosis are causing increased bleeding, with no signs of malignancy. Current management involves observation. Advise follow-up with a gynecologist for potential interventions such as endometrial ablation or hysterectomy if symptoms worsen.  Ovarian cyst   A simple cyst on the right ovary is likely related to ovulation, with no concerning features or  symptoms. No immediate intervention is required.     Return if symptoms worsen or fail to improve.  Jenkins CHRISTELLA Carrel, MD     [1]  Current Outpatient Medications:    albuterol  (VENTOLIN  HFA) 108 (90 Base) MCG/ACT inhaler, Inhale 2 puffs into the lungs every 6 (six) hours as needed for wheezing or shortness of breath., Disp: 8 g, Rfl: 0   azithromycin  (ZITHROMAX ) 250 MG tablet, Take 2 tablets on day 1, then 1 tablet daily on days 2 through 5, Disp: 6 tablet, Rfl: 0   benzonatate  (TESSALON ) 100 MG capsule, Take 1 capsule (100 mg total) by mouth 2 (two) times daily as needed for cough., Disp: 20 capsule, Rfl: 0   norethindrone  (AYGESTIN ) 5 MG tablet, Take 1 tablet (5 mg total) by mouth 3 times per day, till bleeding stops or max 7 days, Disp: 21 tablet, Rfl: 0 [2] No Known Allergies
